# Patient Record
Sex: Male | Born: 1975 | Race: White | Hispanic: No | Marital: Single
Health system: Southern US, Community
[De-identification: ages and names within clinical notes are randomized; demographics above are authoritative.]

## PROBLEM LIST (undated history)

## (undated) DIAGNOSIS — E079 Disorder of thyroid, unspecified: Secondary | ICD-10-CM

## (undated) HISTORY — PX: TONSILLECTOMY: SUR1361

## (undated) HISTORY — PX: APPENDECTOMY: SHX54

## (undated) HISTORY — PX: HERNIA REPAIR: SHX51

## (undated) HISTORY — DX: Disorder of thyroid, unspecified: E07.9

---

## 2017-10-28 ENCOUNTER — Telehealth: Payer: Self-pay

## 2017-10-28 NOTE — Telephone Encounter (Signed)
10/28/2017 1:56PM- Called and spoke with patient, patient is aware of appointment [place] Dr. [place] [place]. Camelia EngKLopez, MA

## 2017-11-01 ENCOUNTER — Ambulatory Visit (INDEPENDENT_AMBULATORY_CARE_PROVIDER_SITE_OTHER): Payer: BLUE CROSS/BLUE SHIELD | Admitting: Urology

## 2017-11-01 ENCOUNTER — Encounter: Payer: Self-pay | Admitting: Urology

## 2017-11-01 VITALS — BP 117/76 | HR 70 | Temp 97.4°F | Resp 16 | Ht 70.0 in | Wt 180.0 lb

## 2017-11-01 DIAGNOSIS — N529 Male erectile dysfunction, unspecified: Principal | ICD-10-CM

## 2017-11-01 MED ORDER — SILDENAFIL CITRATE 20 MG OR TABS
ORAL_TABLET | ORAL | 3 refills | Status: DC
Start: 2017-11-01 — End: 2017-11-28

## 2017-11-01 MED ORDER — ARMOUR THYROID 15 MG OR TABS
ORAL_TABLET | ORAL | 2 refills | Status: AC
Start: 2017-08-13 — End: ?

## 2017-11-01 MED ORDER — SILDENAFIL CITRATE 20 MG OR TABS
ORAL_TABLET | ORAL | 3 refills | Status: DC
Start: 2017-11-01 — End: 2017-11-01

## 2017-11-01 NOTE — Patient Instructions (Signed)
1.Follow up in 4 weeks with a scrotal ultrasound to be completed prior to appointment      2.Please call radiology to schedule at [phone]    3. You may download GoodRx app to get coupons

## 2017-11-01 NOTE — Progress Notes (Signed)
Department of Urology  [facility]    Urology Clinic Visit  Visit Date: November 01, 2017    Referring Physician: Self, Referred    Primary Care Physician: Edward Galvan    Chief Complaint/Reason for Consultation:    Right epididymal cyst  Mild ED       History of Present Illness:  Edward Galvan is a 42 year old male who presents with long standing right epididymal cyst. He reports mild intermittent pain but it has been relatively asymptomatic for long time. He likes to keep an eye with scrotal US. He has been having tension at tip of penis for long time and he had inguinal hernia surgery 7 weeks ago. He also reports mild ED.     IIEF: 18-22     Past Medical and surgical History:  Reviewed        Family History:  No FH of GU cancer    Social History:  Social History     Socioeconomic History    Marital status: Married     Spouse name: Not on file    Number of children: Not on file    Years of education: Not on file    Highest education level: Not on file   Occupational History    Not on file   Social Needs    Financial resource strain: Not on file    Food insecurity:     Worry: Not on file     Inability: Not on file    Transportation needs:     Medical: Not on file     Non-medical: Not on file   Tobacco Use    Smoking status: Not on file   Substance and Sexual Activity    Alcohol use: Not on file    Drug use: Not on file    Sexual activity: Not on file   Lifestyle    Physical activity:     Days per week: Not on file     Minutes per session: Not on file    Stress: Not on file   Relationships    Social connections:     Talks on phone: Not on file     Gets together: Not on file     Attends religious service: Not on file     Active member of club or organization: Not on file     Attends meetings of clubs or organizations: Not on file     Relationship status: Not on file    Intimate partner violence:     Fear of current or ex partner: Not on file      Emotionally abused: Not on file     Physically abused: Not on file     Forced sexual activity: Not on file   Other Topics Concern    Not on file   Social History Narrative    Not on file        Review of Systems:  The review of systems was negative except as per HPI    Medications:  No current outpatient medications on file.     No current facility-administered medications for this visit.        Allergies:  Allergies not on file    Physical Examination:  Vital Signs: There were no vitals taken for this visit.    Constitutional: Appropriately groomed, not in acute distress, no major deformities  Psychiatric: Normally appearing affect and stable mood well oriented to surroundings   Head including  Eyes, Ears, Nose, Mouth and Throat: no obvious masses. No abnormalities detected, no major visual or hearing loss  Neck: No obvious masses. No abnormalities detected, no deviation of trachea  Breast Exam: Not performed  Respiratory: Unlabored respiratory effort without use of accessory muscles   Cardiovascular: Normal pulses, normal heart rate  Skin: No major cutaneous abnormalities noted    Musculoskeletal: No deformities, tenderness, no limitations for movements  Neurological: No major sensory or motor loss  Abdominal: Soft, no tenderness, no masses or organ enlargement  Back: No costovertebral angel tenderness bilaterally  Genitourinary:   Bladder: Non-palpable, non-tender     Penis: No lesions, masses, no scarring or deformity, no urethral discharge     Scrotum: 1 to 1.5 cm right epididymal cyst,  no clinical hydrocele or varicocele    Testicles: No palpable masses, average size and normal consistency           Assessment & Plan:   Edward Galvan is a 42 year old male with   Right epididymal cyst  Mild ED    For   1) Scrotal US  2) Sildenafil 20 mg on demand before sexual activity

## 2017-11-19 ENCOUNTER — Telehealth: Payer: Self-pay | Admitting: Urology

## 2017-11-19 NOTE — Telephone Encounter (Signed)
Patient states he misplaced RX for sildenafil and would like Rx reprinted to sen to [facility] on file.   Per patient, rx is not at pharmacy.   Please call him back to update.   Detailed message ok.   Thank you

## 2017-11-27 NOTE — Telephone Encounter (Signed)
2ND CALL see previous message from 11/19/17    Patient requesting a call back from office, would like to confirm if office has called of faxed Rx for Sildenafil to pharmacy. He states he misplaced the RX that was provided to him. Please call him for further assistance. Thank you

## 2017-11-28 ENCOUNTER — Other Ambulatory Visit: Payer: Self-pay

## 2017-11-28 MED ORDER — SILDENAFIL CITRATE 20 MG OR TABS
ORAL_TABLET | ORAL | 3 refills | Status: AC
Start: 2017-11-28 — End: ?

## 2017-11-28 NOTE — Telephone Encounter (Signed)
Med is set for the MD to review.

## 2017-11-28 NOTE — Telephone Encounter (Signed)
Patient is notified that the prescription is sent.   All questions answered.

## 2017-11-29 ENCOUNTER — Ambulatory Visit: Payer: BLUE CROSS/BLUE SHIELD | Admitting: Urology

## 2017-11-29 ENCOUNTER — Telehealth: Payer: Self-pay | Admitting: Urology

## 2017-11-29 NOTE — Telephone Encounter (Signed)
Called and left message for the patient regarding his appointment for today. Dr. Art BuffYaacoub had ordered for the patient to complete a scrotal ultrasound for today's appointment to go over results, looking at the chart review, patient has not completed the imaging. I left our call back number for the patient to call back and get rescheduled, giving him enough time to get imaging done for review.

## 2018-11-17 ENCOUNTER — Ambulatory Visit: Payer: Self-pay

## 2018-11-17 NOTE — Telephone Encounter (Signed)
Received the message from the system indicating that patient requests to have an appt for a stone specialist.      Called [phone] but no answer.  Left a voice message for the patient to call back.

## 2018-11-18 NOTE — Telephone Encounter (Signed)
Called [phone] but no answer. Left a voice message for the patient to call back.

## 2018-11-19 NOTE — Telephone Encounter (Signed)
PROVIDER ACTION REQUESTED: NO, FYI Only  RN ACTION: Advice given  Reason for Call: Consultation     Disposition:  An appt is scheduled with Dr. Alva Garnet on 11/26/2018 at 1000 am       Reason for Disposition  . [1] Painless rash (e.g., redness, tiny bumps, sore) AND [2] present > 24 hours    Additional Information  . Negative: Followed a genital area injury  . Negative: Pain or burning with passing urine is main symptom  . Negative: Pain in scrotum or testicle is main symptom  . Negative: Swollen scrotum OR lump in the scrotum/groin area  . Negative: Pubic lice suspected  . Negative: [1] Blood from end of penis AND [2] large amount  . Negative: [1] Not circumcised AND [2] foreskin pulled back and stuck  . Negative: [1] Looks infected (e.g., draining sore, ulcer, rash is painful to touch) AND [2] fever > 100.5 F (38.1 C)  . Negative: [1] Unable to urinate (or only a few drops) > 4 hours AND     [2] bladder feels very full (e.g., palpable bladder or strong urge to urinate)  . Negative: [1] Erection AND [2] present > 4 hours  . Negative: Patient sounds very sick or weak to the triager  . Negative: [1] Pus or bloody discharge from the end of the penis AND [2] fever  . Negative: [1] Pain or burning with passing urine AND [2] fever > 100.5 F (38.1 C)  . Negative: [1] Pain or burning with passing urine AND [2] side (flank) or back pain present  . Negative: Entire penis is swollen (i.e., edema)  . Negative: [1] Antibiotic treatment > 3 days for STD (e.g., penile discharge from gonorrhea, chlamydia)    AND [2] painful urination not improved  . Negative: Pus (white, yellow) or bloody discharge from end of penis  . Negative: Pain or burning with passing urine  . Negative: [1] Not circumcised AND [2] swollen foreskin  . Negative: [1] Tiny water blisters rash AND [2] 3 or more  . Negative: Looks infected (e.g., draining sore, ulcer, rash is painful to touch)  . Negative: Blood in urine (red, pink, or tea-colored)    Answer  Assessment - Initial Assessment Questions  1. SYMPTOM: "What's the main symptom you're concerned about?" (e.g., discharge from penis, rash, pain, itching, swelling)      Small lumps   2. LOCATION: "Where is the lump located?"      Penile shaft   3. ONSET: "When did lumps start?"      2 - 3 months  4. PAIN: "Is there any pain?" If so, ask: "How bad is it?"  (Scale 1-10; or mild, moderate, severe)      None  5. URINE: "Any difficulty passing urine?" If so, ask: "When was the last time?"      None  6. CAUSE: "What do you think is causing the symptoms?"      None  7. OTHER SYMPTOMS: "Do you have any other symptoms?" (e.g., fever, abdominal pain, blood in urine)      None    Protocols used: PENIS AND SCROTUM [facility]

## 2018-11-19 NOTE — Telephone Encounter (Signed)
Spoke with the patient who requests to set up an appt for Vasectomy consultation and non painful lumps in penile ares for a couple months.     An appt is scheduled with Dr. Alva Garnet on 11/26/2018 at 1000 am.     All questions answered.

## 2018-11-26 ENCOUNTER — Encounter: Payer: Self-pay | Admitting: Urology

## 2018-11-26 ENCOUNTER — Ambulatory Visit: Payer: BLUE CROSS/BLUE SHIELD | Admitting: Urology

## 2018-11-26 VITALS — BP 114/75 | HR 88 | Temp 98.1°F | Ht 70.0 in | Wt 179.9 lb

## 2018-11-26 DIAGNOSIS — Z3009 Encounter for other general counseling and advice on contraception: Secondary | ICD-10-CM

## 2018-11-26 DIAGNOSIS — N503 Cyst of epididymis: Secondary | ICD-10-CM

## 2018-11-26 MED ORDER — ADDERALL XR 10 MG OR CP24
ORAL_CAPSULE | ORAL | Status: AC
Start: 2018-10-12 — End: ?

## 2018-11-26 MED ORDER — THYROID 15 MG OR TABS
ORAL_TABLET | ORAL | Status: AC
Start: 2017-11-11 — End: ?

## 2018-11-26 MED ORDER — ARMOUR THYROID 90 MG OR TABS
90.00 mg | ORAL_TABLET | Freq: Every day | ORAL | Status: AC
Start: 2018-11-17 — End: ?

## 2018-11-26 NOTE — Progress Notes (Signed)
Barnet Glasgowoss Azusena Erlandson, MD  Assistant Clinical Professor of Urology  [facility] [place] -- [address]  [address] [address]  [place], [place] [id number]  [phone]    New Patient Visit    Visit Date: 11/26/2018    Referring Physician/Primary Care Physician: Becky Augustaugan, Jason     Chief Complaint/Reason for Consultation: Interest in elective sterilzation    History of Present Illness:  Dear Dr. Evert Kohlugan,  Thank you very much for referring Peri MarisGeordie Yera to our practice.    This is a 43 year old male who presents with history of epididymal cysts, and interest in vasectomy.    Number of children: 0 (girlfriend has one)    Current contraception: condoms    He reports he and his partner wish to have no more children.      He denies having any history of significant scrotal trauma, infections, or previous procedures.  Denies having any dysuria, hematuria, incontinence, constipation, or sensation of urinary retention. Patient also denies any history of urinary stones, UTI/STI, or episodes of acute retention.    He did have an episode of testicular pain (left) this was following some some intermittent sex activity for 1-2 hours.  Resolved after the following day.      He previously had mild erectile dysfunction, but this has improved.      Past Medical History:  Patient Active Problem List   Diagnosis   . Erectile dysfunction, unspecified erectile dysfunction type   . Epididymal cyst   Hypothyroidism  ?ADHD    Past Surgical History:  Appendectomy  RIH repair with mesh    Family History:  No known GU malignancies    Social History:  Social History     Socioeconomic History   . Marital status: Married     Spouse name: Not on file   . Number of children: Not on file   . Years of education: Not on file   . Highest education level: Not on file   Occupational History   . Not on file   Tobacco Use   . Smoking status: Never Smoker   . Smokeless tobacco: Never Used   Substance and Sexual Activity   . Alcohol use: Not on file   . Drug use: Not on  file   . Sexual activity: Not on file   Social Activities of Daily Living Present   . Not on file   Social History Narrative   . Not on file   Lives with girlfriend  Works in computers  No tobacco  --No children; Girlfriend has one daughter who is [id number].      Review of Systems:  12-point review of system was conducted including: systemic review, skin, head-eyes-ears-nose-throat, respiratory, cardiovascular, gastrointestinal, endocrine, genitourinary, sexual, musculoskeletal, neuro-psychiatric, and hematologic. The review of systems was negative except as per HPI     Medications:  Current Outpatient Medications   Medication Sig Dispense Refill   . ADDERALL XR 10 MG XR capsule TAKE 1 CAPSULE IN THE MORNING     . ARMOUR THYROID 15 MG tablet TAKE 1 TABLET BY MOUTH DAILY BEFORE BREAKFAST. TAKE WITH 90MG  TABLET  2   . ARMOUR THYROID 90 MG tablet Take 90 mg by mouth daily.     . sildenafil (REVATIO, VIAGRA) 20 MG tablet One tablet 1 hour before sexual activity 90 tablet 3   . thyroid (ARMOUR THYROID) 15 MG tablet TAKE 1 TABLET BY MOUTH DAILY BEFORE BREAKFAST. TAKE WITH 90MG  TABLET  No current facility-administered medications for this visit.         Allergies:  Allergies   Allergen Reactions   . Horse-Derived Products Rash        Physical Examination:  Vital Signs: BP 114/75 (BP Location: Left arm, BP Patient Position: Sitting, BP cuff size: Regular)   Pulse 88   Temp 98.1 F (36.7 C) (Temporal)   Ht 5\' 10"  (1.778 m)   Wt 81.6 kg (179 lb 14.3 oz)   BMI 25.81 kg/m   General: Awake, alert, well nourished, appears to be approximate age  HEENT: Normocephalic, atraumatic  Neck: Supple, trachea midline  Lungs: Normal respiratory effort   Cardiovascular: Regular rate to palpation  Abdomen: Soft, nontender, and nondistended  GU: Normal appearing phallus, glans, and urethral meatus.  Bilateral descended testicles with no palpable masses or tenderness to palpation.  Bilateral palpable vas deferens.  Right testicle higher  than left.  Small punctate EIC, white, on left anterior scrotum.  Extremities: no costovertebral angle tenderness to palpation bilaterally  Neuro: Grossly intact  Lymph nodes:  No Lymphadenopathy noted      Diagnostic Imaging:    Scrotal US 2017  1. Bilateral epididymal cysts. The right epididymal cyst measures 1.7 x 1.4 x 1.2 cm and corresponds to the patient's palpable finding on physical examination.    2. Normal testes.    3. Left varicocele.    Assessment: 43 year old male with history of bilateral epididymal cyst, right inguinal hernia, small punctate left anterior scrotal skin cysts, and interested elective sterilization via bilateral vasectomy.      Plan:    Total data review of medical history and data, as documented above, and development of new diagnosis and/or the decision making process for this patient (as documented below) to determine appropriate course of further evaluation and treatment is deemed as moderate.       Elective sterilization by vasectomy:  Risks, benefits, and alternatives were discussed.  Risks include but are not limited to scrotal hematoma, infection, post vasectomy chronic pain syndrome (estimated to be 1-2%), injury to the testicles, and recannulization of the vas resulting in unwanted pregnancy at any time in the future without warning despite prior semen analysis showing no sperm; this is estimated to occur 05/998 to 1/10000 cases.  Patient agreed to submit two semen sample after approximately 3 months (12 and 14 weeks).  Until we confirm that there is no sperm in the ejaculate he or his partner need to continue using another form of contraception. Patient understands that this procedure is meant to be permanent and irreversible. All questions were answered and patient wishes to proceed, and so informed consent was again confirmed for bilateral vasectomy.  Below was reviewed extensively with patient: There is no vasectomy technique that is 100% effective, and a definitive  standard to declare a patient sterile has not been agreed upon. Time to reach azoospermia is variable, although over 80% of patients achieve azoospermia by three months and after twenty ejaculations. Persistent nonmotile sperm are present in 1.4% of postvasectomy patients. This data points to obtaining a semen analysis at three months and twenty ejaculations after vasectomy to reveal azoospermia. If the semen analysis does not show azoospermia, periodic semen analyses can be obtained until azoospermia is achieved. Patients who have small numbers of persistent nonmotile sperm can be advised to cautiously discontinue contraception Valentina Lucks(Griffin et al, 2005). There is evidence that these men will ultimately reach azoospermia. Vasectomy should be repeated if any motile sperm  are found in the ejaculate three months after the initial vasectomy(Aradhya et al, 2005), or persistent large counts of non motile sperm.    I personally consented this patient for a bilateral vasectomy for sterlization and all risks/benefits/alternatives were explained at length.  Patient understands that this is an elective procedure.  All methods of birth control were again discussed.  Patient understands risks of scrotal hematoma, infection, chronic post-vasectomy pain syndrome, spontaneous recannulization of the vas resulting in pregnancy without warning at any time in the future despite a prior semen analysis showing no sperm.  Patient also told that semen analysis will be checked 3 months after the procedure and he needs to consider himself fertile until no motile sperm and no or very low counts of immotile sperm in his ejaculate is documented on 2 separate specimens.  Patient understands that this procedure should be considered permanent and not revesible, since surgical attempts to reverse this procedure often are unsuccessful.   All questions were answered to his satisfaction, he understands the above, and so informed consent was obtained  today.    In regards to his epididymal cyst, and scrotal skin cyst, I recommended continued observation as they are not very bothersome.  Explained we could potentially excise the small epidermal inclusion cyst on his left anterior scrotum, however I would recommend against this doing at the time of his vasectomy since I do think it may increase his risk of infections or other complications.  I explained to him because of his history of right inguinal hernia, and previous episodes of testicular pain or discomfort and/or his epididymal cyst, I do think he is at slightly increased risk of chronic testicular pain as compared to a typical patient.  Patient understands and agrees, and still wishes to proceed.  Patient reports he may consider sperm banking prior to his procedure, although he is confident he does not want to have children, wishes to proceed with bilateral vasectomy for elective sterility, he may wish to have this as an alternative in the future in case he changes his mind and wishes to father a child.    At the conclusion of this encounter all of the patient's questions were answered to satisfaction, encouraged to contact our office at any time if there were any further questions or issues, and we will follow up as described above.    Portions of documentation was done with dictation software, and so there may be errors despite best efforts to review and edit.      Summary:   Observation of epididymal cysts and scrotal skin cyst  Proceed with scheduling a bilateral vasectomy    Dear Dr. Leandro Reasoner,  Thank you very much for referring this patient to our practice, please contact me at any time if you have any questions regarding the above plan of care, and we will keep you apprised of this patient's progress.    Sincerely,    Otto Herb, M.D.  11/26/2018

## 2018-11-26 NOTE — Patient Instructions (Signed)
We will be scheduling you for an elective sterilization procedure known as a vasectomy.  Please review the below information and contact our office with any questions.        Elective sterilization by vasectomy:  Risks include but are not limited to scrotal hematoma, infection, post vasectomy chronic pain syndrome, injury to the testicles, and recannulization of the vas resulting in unwanted pregnancy at any time in the future without warning despite prior semen analysis showing no sperm. We will have you submit two semen sample after approximately 3 months or 30 ejaculations.  Until we confirm that there is no sperm in the ejaculate you and your partner need to continue using another form of contraception. There is no vasectomy technique that is 100% effective, and a definitive standard to declare a patient sterile has not been agreed upon. Time to reach azoospermia is variable, although over 80% of patients achieve azoospermia by three months and after twenty ejaculations. Persistent nonmotile sperm are present in 1.4% of postvasectomy patients. This data points to obtaining a semen analysis at three months and twenty ejaculations after vasectomy to reveal azoospermia. If the semen analysis does not show azoospermia, periodic semen analyses can be obtained until azoospermia is achieved. Patients who have small numbers of persistent nonmotile sperm can be advised to cautiously discontinue contraception (Griffin et al, 2005). There is evidence that these men will ultimately reach azoospermia. Vasectomy should be repeated if any motile sperm are found in the ejaculate three months after the initial vasectomy (Aradhya et al, 2005).      Having a Vasectomy: Before, During, and After the Procedure  Vasectomy is an outpatient (same day) procedure. It can be done in a doctor's office, clinic, or hospital. Your doctor will talk with you about preparing for surgery. He or she will also discuss the possible risks and  complications with you. After the procedure, follow your doctor's advice for recovery.    Your doctor will talk with you about getting ready for the procedure. You may be asked to do the following:   Sign a consent form. It gives your doctor permission to do the procedure. It also states that a vasectomy is not guaranteed to make you sterile.   Don't take aspirin, ibuprofen, or naproxen for 1 week prior to the procedure. These medications can cause bleeding after the procedure. Also, tell your doctor if you take any medications, supplements, or herbal remedies.   Tell your doctor if you've had any prior scrotal surgery.   Arrange for an adult family member or friend to give you a ride home after the procedure.   Shower and clean your scrotum the day of the procedure. Your doctor may also ask you to shave your scrotum.   Bring an athletic supporter (jockstrap) or pair of snug cotton briefs to the doctor's office or hospital.   Eat light meals/snacks prior to and following the procedure     During surgery   You'll be asked to undress and lie on a table.   You may be given medication to help you relax. To prevent pain during surgery, you'll be given an injection of local anesthetic in your scrotum or lower groin.   Once the area is numb, one or two small incisions are made in the scrotum. This may be done with a scalpel or with a pointed clamp (no-scalpel method).   The vas deferens are lifted through the incision and cut. The ends of the vas are then sealed off   with stitches.  You can rest for a while until you're ready to go home.  Recovering at home  For about a week, your scrotum may look bruised and slightly swollen. You may also have a small amount of bloody discharge from the incision. This is normal.  To help make your recovery more comfortable, follow the tips below.  Stay off your feet as much as possible for the first 2 days. Try to lie flat on a bed or  sofa.  Wear an athletic supporter or snug cotton briefs for support.  Reduce swelling by placing an ice pack or bag of frozen peas in a thin towel. Then place the towel on your scrotum.  Take medications with acetaminophen (such as Tylenol) to relieve any discomfort. Don't use aspirin, ibuprofen, or naproxen for 24-48 hours after the procedure.  Wait 24 hours before bathing.  Avoid heavy lifting or exercise for 14 days.  Ask your doctor how long to wait before having sex again. Remember: You must use another form of birth control until you're completely sterile.  When to seek medical care  Call your doctor if you notice any of the following after surgery:  Increasing pain or swelling in your scrotum  A large black-and-blue area, or a growing lump  Fever or chills  Increasing redness or drainage of the incision  Trouble urinating   Sex after vasectomy  Vasectomy doesn't change your sexual function. So when you start having sex again, it should feel the same as before. A vasectomy also shouldn't affect your relationship with your partner. It's important to remember, though, that you won't become sterile right away. It will take time before you can have sex without the need for birth control.  Until you're sterile: After a vasectomy, some active sperm still remain in your semen. It will take time and many ejaculations before the sperm are completely gone. During this period, you must use another birth control method to prevent pregnancy. To make sure no sperm are left in your semen, you'll need to have one or more semen exams. You usually collect a semen sample at home and bring it to a lab. The sample is then checked under a microscope. You're sterile only when these samples show no evidence of sperm. Ask your doctor whether additional follow-up is needed.  After you're sterile: After your doctor tells you you're sterile, you no longer need to use any form of birth control. You're free to have sex without the fear of  unwanted pregnancy. However, a vasectomy does not protect you from sexually transmitted diseases (STDs). If you have more than one sex partner, be sure to practice safer sex by using condoms.   2000-2016 [employer] [address], [place] [address]. All rights reserved. This information is not intended as a substitute for professional medical care. Always follow your healthcare professional's instructions.        Vasectomy: Risks and Complications  A vasectomy is an outpatient procedure. This means you'll go home the same day. It's done in a doctor's office, clinic, or hospital. Before your procedure, you'll be asked to read and sign a consent form. This form states you're aware of the possible risks and complications. It also says that you understand that the procedure, though most often successful, can't promise to make you sterile. Be sure you have all your questions answered before you sign this form. Below is a list of risks and possible complications of the procedure.    you sterile. Be sure you have all your questions answered before you sign this form. Below is a list of risks and possible complications of the procedure.  Risks and Possible Complications of Vasectomy  Vasectomy is safe. But it does have risks. They include the following:   Bleeding or infection   Sperm granuloma. This is a small, harmless lump. It may form where the vas deferens is sealed off.   Sperm buildup (congestion). This may cause soreness in the testes. Anti-inflammatory medications can provide relief.   Epididymitis. This is inflammation that may cause scrotal aching. It often goes away without treatment. Anti-inflammatory medications can provide relief.   Reconnection of the vas deferens. This can occur in rare cases. It makes you fertile again. This may result in an unplanned pregnancy.   Sperm antibodies. Develpoing antibodies is a common response of your body to the absorbed sperm. The antibodies can make you sterile. This is true even if you later try to reverse your vasectomy.   Long-term  testicular discomfort. This may occur after surgery. But it's very rare.    2000-2016 [employer] [address], [place] [address]. All rights reserved. This information is not intended as a substitute for professional medical care. Always follow your healthcare professional's instructions.

## 2018-12-30 ENCOUNTER — Ambulatory Visit: Payer: BLUE CROSS/BLUE SHIELD | Admitting: Urology

## 2020-10-26 NOTE — Progress Notes (Signed)
[facility] [address] [place] [address] Phone: [phone] Subjective:   Benjamin Wolfe, am serving as a scribe for Dr. Antoine Wolfe.  This visit occurred during the SARS-CoV-2 public health emergency.  Safety protocols were in place, including screening questions prior to the visit, additional usage of staff PPE, and extensive cleaning of exam room while observing appropriate contact time as indicated for disinfecting solutions.    I'm seeing this patient by the request  of:  No primary care provider on file.  CC: Right shoulder and hip pain  DXA:[id number]  Benjamin Wolfe is a 45 y.o. male coming in with complaint of R shoulder and hip pain. Patient states that he has had pain over superior aspect with flexion. No injury.  Patient tries to stay very active.  States that his symptoms seems to be on the top of the shoulder.  Gives him some discomfort with certain range of motion and sometimes even at night.  Denies radiation down the arm.  Denies any weakness.  Patient notes R sided hip pain over greater trochanter. Hip flexion can hurt at times. Does stretch a lot. Pain with bending over to put on shoes.  Patient has been trying to be active but finds it difficult.  Patient sits for work.        Social History   Socioeconomic History   Marital status: Single    Spouse name: Not on file   Number of children: Not on file   Years of education: Not on file   Highest education level: Not on file  Occupational History   Not on file  Tobacco Use   Smoking status: Not on file   Smokeless tobacco: Not on file  Substance and Sexual Activity   Alcohol use: Not on file   Drug use: Not on file   Sexual activity: Not on file  Other Topics Concern   Not on file  Social History Narrative   Not on file   Social Determinants of Health   Financial Resource Strain: Not on file  Food Insecurity: Not on file  Transportation Needs: Not on  file  Physical Activity: Not on file  Stress: Not on file  Social Connections: Not on file   Not on File History reviewed. No pertinent family history.   Current Outpatient Medications (Cardiovascular):    atorvastatin (LIPITOR) 10 MG tablet, Take by mouth.     Current Outpatient Medications (Other):    ALPRAZolam (XANAX) 0.5 MG tablet, Take by mouth.   buPROPion (WELLBUTRIN XL) 150 MG 24 hr tablet, Take 1 tablet by mouth every morning.   gabapentin (NEURONTIN) 100 MG capsule, Take 100 mg by mouth 3 (three) times daily.   Reviewed prior external information including notes and imaging from  primary care provider As well as notes that were available from care everywhere and other healthcare systems.  Past medical history, social, surgical and family history all reviewed in electronic medical record.  No pertanent information unless stated regarding to the chief complaint.   Review of Systems:  No headache, visual changes, nausea, vomiting, diarrhea, constipation, dizziness, abdominal pain, skin rash, fevers, chills, night sweats, weight loss, swollen lymph nodes, body aches, joint swelling, chest pain, shortness of breath, mood changes. POSITIVE muscle aches  Objective  Blood pressure 118/82, pulse 74, weight 168 lb (76.2 kg), SpO2 98 %.   General: No apparent distress alert and oriented x3 mood and affect normal, dressed appropriately.  Patient is  moderately anxious at baseline HEENT: Pupils equal, extraocular movements intact  Respiratory: Patient's speak in full sentences and does not appear short of breath  Cardiovascular: No lower extremity edema, non tender, no erythema  Gait normal with good balance and coordination.  MSK: Right shoulder exam shows the patient does have mild positive crossover noted.  Good range of motion though otherwise.  Tender to palpation over the acromioclavicular joint.  5 out of 5 strength of the rotator cuff noted.  Very mild positive  O'Brien's.  Right hip exam does have some tightness noted with FABER test.  Patient does have a good range of motion of the hip.  Patient is severely tender to palpation over the greater trochanteric area on the right side   Limited musculoskeletal ultrasound was performed and interpreted by Benjamin Wolfe  Limited ultrasound of patient's right shoulder shows the patient rotator cuff appears to be unremarkable.  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  Hypoechoic changes noted with effusion as well.  Regarding his right hip patient does have what appears to be a greater trochanteric bursitis with hypoechoic changes but no true tearing of the gluteal tendon laterally.  No cortical irregularity of the lateral femur.  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  This included exercises focusing on stretching, strengthening, with significant focus on eccentric aspects.   Long term goals include an improvement in range of motion, strength, endurance as well as avoiding reinjury. Patient's frequency would include in 1-2 times a day, 3-5 times a week for a duration of 6-12 weeks. Shoulder Exercises that included:  Basic scapular stabilization to include adduction and depression of scapula Scaption, focusing on proper movement and good control Internal and External rotation utilizing a theraband, with elbow tucked at side entire time Rows with theraband   Proper technique shown and discussed handout in great detail with ATC.  All questions were discussed and answered.     Impression and Recommendations:     The above documentation has been reviewed and is accurate and complete Benjamin Saa, DO

## 2020-10-27 ENCOUNTER — Other Ambulatory Visit: Payer: Self-pay

## 2020-10-27 ENCOUNTER — Ambulatory Visit: Payer: BC Managed Care – PPO | Admitting: Family Medicine

## 2020-10-27 ENCOUNTER — Ambulatory Visit (INDEPENDENT_AMBULATORY_CARE_PROVIDER_SITE_OTHER): Payer: BC Managed Care – PPO

## 2020-10-27 ENCOUNTER — Encounter: Payer: Self-pay | Admitting: Family Medicine

## 2020-10-27 ENCOUNTER — Ambulatory Visit: Payer: Self-pay

## 2020-10-27 VITALS — BP 118/82 | HR 74 | Wt 168.0 lb

## 2020-10-27 DIAGNOSIS — M25511 Pain in right shoulder: Secondary | ICD-10-CM

## 2020-10-27 DIAGNOSIS — M7061 Trochanteric bursitis, right hip: Secondary | ICD-10-CM

## 2020-10-27 DIAGNOSIS — M25551 Pain in right hip: Secondary | ICD-10-CM

## 2020-10-27 DIAGNOSIS — M19011 Primary osteoarthritis, right shoulder: Secondary | ICD-10-CM | POA: Diagnosis not present

## 2020-10-27 DIAGNOSIS — M19019 Primary osteoarthritis, unspecified shoulder: Secondary | ICD-10-CM | POA: Insufficient documentation

## 2020-10-27 NOTE — Patient Instructions (Signed)
Good to see you 3 boxes of pennsaid 2 times aday Keep hands within peripherial vision Exercises for the hip See me again in 6 weeks

## 2020-10-27 NOTE — Assessment & Plan Note (Signed)
Topical anti-inflammatories given today.  Given home exercises and work with Event organiser.  Discussed different limitation in range of motion of the shoulder with weight lifting.  Patient will see me again in 6 weeks and if no significant improvement will consider injections.

## 2020-10-27 NOTE — Assessment & Plan Note (Signed)
Mild overall.  Discussed with patient about different exercises working on hip abductor strengthening and hip flexor stretching.  We will get x-rays to further evaluate for any bony abnormality.  Patient will follow up with me again in 6 weeks and if no improvement consider injection

## 2020-12-07 NOTE — Progress Notes (Deleted)
[name] [address] [place] [address] Phone: [phone] Subjective:    I'm seeing this patient by the request  of:  No primary care provider on file.  CC:   GYI:[id number]  10/27/2020 Mild overall.  Discussed with patient about different exercises working on hip abductor strengthening and hip flexor stretching.  We will get x-rays to further evaluate for any bony abnormality.  Patient will follow up with me again in 6 weeks and if no improvement consider injection  Topical anti-inflammatories given today.  Given home exercises and work with Event organiser.  Discussed different limitation in range of motion of the shoulder with weight lifting.  Patient will see me again in 6 weeks and if no significant improvement will consider injections.  Update 12/08/2020 Benjamin Wolfe is a 45 y.o. male coming in with complaint of R hip and R shoulder pain.       No past medical history on file. No past surgical history on file. Social History   Socioeconomic History   Marital status: Single    Spouse name: Not on file   Number of children: Not on file   Years of education: Not on file   Highest education level: Not on file  Occupational History   Not on file  Tobacco Use   Smoking status: Not on file   Smokeless tobacco: Not on file  Substance and Sexual Activity   Alcohol use: Not on file   Drug use: Not on file   Sexual activity: Not on file  Other Topics Concern   Not on file  Social History Narrative   Not on file   Social Determinants of Health   Financial Resource Strain: Not on file  Food Insecurity: Not on file  Transportation Needs: Not on file  Physical Activity: Not on file  Stress: Not on file  Social Connections: Not on file   Not on File No family history on file.   Current Outpatient Medications (Cardiovascular):    atorvastatin (LIPITOR) 10 MG tablet, Take by mouth.     Current Outpatient Medications  (Other):    ALPRAZolam (XANAX) 0.5 MG tablet, Take by mouth.   buPROPion (WELLBUTRIN XL) 150 MG 24 hr tablet, Take 1 tablet by mouth every morning.   gabapentin (NEURONTIN) 100 MG capsule, Take 100 mg by mouth 3 (three) times daily.   Reviewed prior external information including notes and imaging from  primary care provider As well as notes that were available from care everywhere and other healthcare systems.  Past medical history, social, surgical and family history all reviewed in electronic medical record.  No pertanent information unless stated regarding to the chief complaint.   Review of Systems:  No headache, visual changes, nausea, vomiting, diarrhea, constipation, dizziness, abdominal pain, skin rash, fevers, chills, night sweats, weight loss, swollen lymph nodes, body aches, joint swelling, chest pain, shortness of breath, mood changes. POSITIVE muscle aches  Objective  There were no vitals taken for this visit.   General: No apparent distress alert and oriented x3 mood and affect normal, dressed appropriately.  HEENT: Pupils equal, extraocular movements intact  Respiratory: Patient's speak in full sentences and does not appear short of breath  Cardiovascular: No lower extremity edema, non tender, no erythema  Gait normal with good balance and coordination.  MSK:  Non tender with full range of motion and good stability and symmetric strength and tone of shoulders, elbows, wrist, hip, knee and ankles bilaterally.  Impression and Recommendations:     The above documentation has been reviewed and is accurate and complete Benjamin Wolfe

## 2020-12-08 ENCOUNTER — Ambulatory Visit: Payer: BC Managed Care – PPO | Admitting: Family Medicine

## 2021-06-13 ENCOUNTER — Ambulatory Visit (INDEPENDENT_AMBULATORY_CARE_PROVIDER_SITE_OTHER): Payer: BC Managed Care – PPO

## 2021-06-13 ENCOUNTER — Ambulatory Visit: Payer: Self-pay

## 2021-06-13 ENCOUNTER — Other Ambulatory Visit: Payer: Self-pay

## 2021-06-13 ENCOUNTER — Ambulatory Visit: Payer: BC Managed Care – PPO | Admitting: Family Medicine

## 2021-06-13 VITALS — BP 120/82 | HR 96 | Ht 70.0 in | Wt 180.0 lb

## 2021-06-13 DIAGNOSIS — M25561 Pain in right knee: Secondary | ICD-10-CM

## 2021-06-13 DIAGNOSIS — G8929 Other chronic pain: Secondary | ICD-10-CM

## 2021-06-13 DIAGNOSIS — M19011 Primary osteoarthritis, right shoulder: Secondary | ICD-10-CM

## 2021-06-13 DIAGNOSIS — M25511 Pain in right shoulder: Secondary | ICD-10-CM | POA: Diagnosis not present

## 2021-06-13 NOTE — Assessment & Plan Note (Signed)
patient given injection and tolerated the procedure well.  Discussed icing regimen and home exercise.  Recheck to consider which wants to avoid.  Patient responded really typically well.  Discussed home exercises.  Follow-up again in 6 to 8 weeks

## 2021-06-13 NOTE — Progress Notes (Signed)
[name] [address] [place] [address] Phone: [phone] Subjective:   Benjamin Wolfe, am serving as a scribe for Dr. Antoine Primas.This visit occurred during the SARS-CoV-2 public health emergency.  Safety protocols were in place, including screening questions prior to the visit, additional usage of staff PPE, and extensive cleaning of exam room while observing appropriate contact time as indicated for disinfecting solutions.   I'm seeing this patient by the request  of:  Pcp, No  CC: Right knee pain  AGT:[id number]  Benjamin Wolfe is a 46 y.o. male coming in with complaint of B distal bicep, AC separation and R knee pain. Pain in R shoulder persists. Feels that he may have injured himself doing DB rows.  Patient seen in June 2022. Patient states that the he also has pain over patella for years. No injury to this area. Painful with movement especially steps.       No past medical history on file. No past surgical history on file. Social History   Socioeconomic History   Marital status: Single    Spouse name: Not on file   Number of children: Not on file   Years of education: Not on file   Highest education level: Not on file  Occupational History   Not on file  Tobacco Use   Smoking status: Not on file   Smokeless tobacco: Not on file  Substance and Sexual Activity   Alcohol use: Not on file   Drug use: Not on file   Sexual activity: Not on file  Other Topics Concern   Not on file  Social History Narrative   Not on file   Social Determinants of Health   Financial Resource Strain: Not on file  Food Insecurity: Not on file  Transportation Needs: Not on file  Physical Activity: Not on file  Stress: Not on file  Social Connections: Not on file   Not on File No family history on file.   Current Outpatient Medications (Cardiovascular):    atorvastatin (LIPITOR) 10 MG tablet, Take by mouth.     Current Outpatient  Medications (Other):    ALPRAZolam (XANAX) 0.5 MG tablet, Take by mouth.   buPROPion (WELLBUTRIN XL) 150 MG 24 hr tablet, Take 1 tablet by mouth every morning.   gabapentin (NEURONTIN) 100 MG capsule, Take 100 mg by mouth 3 (three) times daily.   Reviewed prior external information including notes and imaging from  primary care provider As well as notes that were available from care everywhere and other healthcare systems.  Past medical history, social, surgical and family history all reviewed in electronic medical record.  No pertanent information unless stated regarding to the chief complaint.   Review of Systems:  No headache, visual changes, nausea, vomiting, diarrhea, constipation, dizziness, abdominal pain, skin rash, fevers, chills, night sweats, weight loss, swollen lymph nodes, body aches, joint swelling, chest pain, shortness of breath, mood changes. POSITIVE muscle aches  Objective  Blood pressure 120/82, pulse 96, height 5\' 10"  (1.778 m), weight 180 lb (81.6 kg), SpO2 98 %.   General: No apparent distress alert and oriented x3 mood and affect normal, dressed appropriately.  HEENT: Pupils equal, extraocular movements intact  Respiratory: Patient's speak in full sentences and does not appear short of breath  Cardiovascular: No lower extremity edema, non tender, no erythema   right knee exam has good range of motion.  Very mild lateral tracking of the patella noted.  Does have VMOMinorly smaller  than the contralateral side. Right shoulder continues to have tenderness to palpation over the paraspinal musculature Pain over the acromioclavicular joint.  Positive crossover.  Bicep ttp but no masses or swelling, no pain with supination or pronation against resistence   Limited muscular skeletal ultrasound was performed and interpreted by Antoine Primas, M   Limited ultrasound of the shoulder still shows hypoechoic changes of the acromioclavicular joint.  Patient's right knee has no  significant remarkable findings at the moment.   Procedure: Real-time Ultrasound Guided Injection of the right acromioclavicular joint Device: GE Logiq Q7 Ultrasound guided injection is preferred based studies that show increased duration, increased effect, greater accuracy, decreased procedural pain, increased response rate, and decreased cost with ultrasound guided versus blind injection.  Verbal informed consent obtained.  Time-out conducted.  Noted no overlying erythema, induration, or other signs of local infection.  Skin prepped in a sterile fashion.  Local anesthesia: Topical Ethyl chloride.  With sterile technique and under real time ultrasound guidance: With a 25-gauge half inch needle injected with 0.5 cc of 0.5% Marcaine and 0.5 cc of Kenalog 40 mg/mL Completed without difficulty  Pain immediately resolved suggesting accurate placement of the medication.  Advised to call if fevers/chills, erythema, induration, drainage, or persistent bleeding.  Impression: Technically successful ultrasound guided injection.     Impression and Recommendations:     The above documentation has been reviewed and is accurate and complete Judi Saa, DO

## 2021-06-13 NOTE — Patient Instructions (Addendum)
Xray today Exercises knee Arm compression  Drop weight 50%  Increase slowly See me in 6 weeks

## 2021-07-19 NOTE — Progress Notes (Signed)
?Terrilee Files D.O. ?[facility] ?[address] [place] [address] ?Phone: [phone] ?Subjective:   ?I, Nadine Counts, am serving as a Neurosurgeon for Dr. Antoine Primas. ?This visit occurred during the SARS-CoV-2 public health emergency.  Safety protocols were in place, including screening questions prior to the visit, additional usage of staff PPE, and extensive cleaning of exam room while observing appropriate contact time as indicated for disinfecting solutions.  ? ?I'm seeing this patient by the request  of:  Pcp, No ? ?CC: Right shoulder pain and right knee pain ? ?[id number]  ?06/13/2021 ? patient given injection and tolerated the procedure well.  Discussed icing regimen and home exercise.  Recheck to consider which wants to avoid.  Patient responded really typically well.  Discussed home exercises.  Follow-up again in 6 to 8 weeks ? ?Update 07/25/2021 ?Benjamin Wolfe is a 46 y.o. male coming in with complaint of R shoulder and R knee pain. Patient states shoulder is doing okay. Bilateral biceps pain. Pain was more towards the elbow. Wants to make sure nothing is mechanically. Haven't been to the gym since last visit. Certain movements cause pain, like full extension. Right ankle pain as well.  ?Patient was given an injection in the acromioclavicular joint on February 7. ? ?Xray R knee 06/13/2021 ?IMPRESSION: ?Mild patella alta. ?  ?  ? ?No past medical history on file. ?No past surgical history on file. ?Social History  ? ?Socioeconomic History  ? Marital status: Single  ?  Spouse name: Not on file  ? Number of children: Not on file  ? Years of education: Not on file  ? Highest education level: Not on file  ?Occupational History  ? Not on file  ?Tobacco Use  ? Smoking status: Not on file  ? Smokeless tobacco: Not on file  ?Substance and Sexual Activity  ? Alcohol use: Not on file  ? Drug use: Not on file  ? Sexual activity: Not on file  ?Other Topics Concern  ? Not on file  ?Social History  Narrative  ? Not on file  ? ?Social Determinants of Health  ? ?Financial Resource Strain: Not on file  ?Food Insecurity: Not on file  ?Transportation Needs: Not on file  ?Physical Activity: Not on file  ?Stress: Not on file  ?Social Connections: Not on file  ? ?Not on File ?No family history on file. ? ? ?Current Outpatient Medications (Cardiovascular):  ?  atorvastatin (LIPITOR) 10 MG tablet, Take by mouth. ? ? ? ? ?Current Outpatient Medications (Other):  ?  ALPRAZolam (XANAX) 0.5 MG tablet, Take by mouth. ?  buPROPion (WELLBUTRIN XL) 150 MG 24 hr tablet, Take 1 tablet by mouth every morning. ?  gabapentin (NEURONTIN) 100 MG capsule, Take 100 mg by mouth 3 (three) times daily. ? ? ?Reviewed prior external information including notes and imaging from  ?primary care provider ?As well as notes that were available from care everywhere and other healthcare systems. ? ?Past medical history, social, surgical and family history all reviewed in electronic medical record.  No pertanent information unless stated regarding to the chief complaint.  ? ?Review of Systems: ? No headache, visual changes, nausea, vomiting, diarrhea, constipation, dizziness, abdominal pain, skin rash, fevers, chills, night sweats, weight loss, swollen lymph nodes, joint swelling, chest pain, shortness of breath, mood changes. POSITIVE muscle aches, body aches ? ?Objective  ?Blood pressure 118/80, pulse 83, height 5\' 10"  (1.778 m), weight 178 lb (80.7 kg), SpO2 98 %. ?  ?General:  No apparent distress alert and oriented x3 mood and affect normal, dressed appropriately.  ?HEENT: Pupils equal, extraocular movements intact  ?Respiratory: Patient's speak in full sentences and does not appear short of breath  ?Cardiovascular: No lower extremity edema, non tender, no erythema  ?Gait normal with good balance and coordination.  ?MSK: Right shoulder exam shows good range of motion at this moment.  5 out of 5 strength. ?Bilateral elbow shows some tenderness  noted over the medial and lateral epicondylar regions.  Patient does have mild abnormality noted of the left elbow with more of a varus deformity noted.  Full range of motion though with good grip strength. ?Right knee exam shows overall relatively good range of motion.  Mild crepitus noted.  Mild lateral tracking noted. ? ? ?Limited muscular skeletal ultrasound was performed and interpreted by Antoine Primas, M  ?Limited ultrasound of the elbow show the patient does have hypertrophy noted of the radial head that seems to be bilateral.  No significant findings other than some mild hyperechoic changes of the underlying musculature that could be consistent with dehydration.  No significant tearing noted of the distal bicep tendon.  No abnormality noted of the supinator. 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 ?  ?Impression and Recommendations:  ?  ? ?The above documentation has been reviewed and is accurate and complete Judi Saa, DO ? ? ? ?

## 2021-07-25 ENCOUNTER — Other Ambulatory Visit: Payer: Self-pay

## 2021-07-25 ENCOUNTER — Ambulatory Visit: Payer: Self-pay

## 2021-07-25 ENCOUNTER — Ambulatory Visit (INDEPENDENT_AMBULATORY_CARE_PROVIDER_SITE_OTHER): Payer: BC Managed Care – PPO | Admitting: Family Medicine

## 2021-07-25 ENCOUNTER — Ambulatory Visit (INDEPENDENT_AMBULATORY_CARE_PROVIDER_SITE_OTHER): Payer: BC Managed Care – PPO

## 2021-07-25 VITALS — BP 118/80 | HR 83 | Ht 70.0 in | Wt 178.0 lb

## 2021-07-25 DIAGNOSIS — M25511 Pain in right shoulder: Secondary | ICD-10-CM

## 2021-07-25 DIAGNOSIS — M2550 Pain in unspecified joint: Secondary | ICD-10-CM | POA: Diagnosis not present

## 2021-07-25 DIAGNOSIS — M25522 Pain in left elbow: Secondary | ICD-10-CM | POA: Diagnosis not present

## 2021-07-25 DIAGNOSIS — M25521 Pain in right elbow: Secondary | ICD-10-CM | POA: Diagnosis not present

## 2021-07-25 DIAGNOSIS — M130 Polyarthritis, unspecified: Secondary | ICD-10-CM

## 2021-07-25 DIAGNOSIS — M25561 Pain in right knee: Secondary | ICD-10-CM | POA: Diagnosis not present

## 2021-07-25 LAB — COMPREHENSIVE METABOLIC PANEL
ALT: 24 U/L (ref 0–53)
AST: 23 U/L (ref 0–37)
Albumin: 4.9 g/dL (ref 3.5–5.2)
Alkaline Phosphatase: 57 U/L (ref 39–117)
BUN: 24 mg/dL — ABNORMAL HIGH (ref 6–23)
CO2: 29 mEq/L (ref 19–32)
Calcium: 10.1 mg/dL (ref 8.4–10.5)
Chloride: 98 mEq/L (ref 96–112)
Creatinine, Ser: 1.27 mg/dL (ref 0.40–1.50)
GFR: 68.07 mL/min (ref >60.00)
Glucose, Bld: 110 mg/dL — ABNORMAL HIGH (ref 70–99)
Potassium: 4.1 mEq/L (ref 3.5–5.1)
Sodium: 136 mEq/L (ref 135–145)
Total Bilirubin: 0.7 mg/dL (ref 0.2–1.2)
Total Protein: 7.5 g/dL (ref 6.0–8.3)

## 2021-07-25 LAB — CBC WITH DIFFERENTIAL/PLATELET
Basophils Absolute: 0.1 10*3/uL (ref 0.0–0.1)
Basophils Relative: 1.3 % (ref 0.0–3.0)
Eosinophils Absolute: 0.2 10*3/uL (ref 0.0–0.7)
Eosinophils Relative: 3.2 % (ref 0.0–5.0)
HCT: 43.9 % (ref 39.0–52.0)
Hemoglobin: 15.6 g/dL (ref 13.0–17.0)
Lymphocytes Relative: 30.6 % (ref 12.0–46.0)
Lymphs Abs: 1.8 10*3/uL (ref 0.7–4.0)
MCHC: 35.6 g/dL (ref 30.0–36.0)
MCV: 87.6 fl (ref 78.0–100.0)
Monocytes Absolute: 0.5 10*3/uL (ref 0.1–1.0)
Monocytes Relative: 8.3 % (ref 3.0–12.0)
Neutro Abs: 3.4 10*3/uL (ref 1.4–7.7)
Neutrophils Relative %: 56.6 % (ref 43.0–77.0)
Platelets: 243 10*3/uL (ref 150.0–400.0)
RBC: 5.01 Mil/uL (ref 4.22–5.81)
RDW: 13 % (ref 11.5–15.5)
WBC: 5.9 10*3/uL (ref 4.0–10.5)

## 2021-07-25 LAB — T4, FREE: Free T4: 0.62 ng/dL (ref 0.60–1.60)

## 2021-07-25 LAB — TSH: TSH: 4.75 u[IU]/mL (ref 0.35–5.50)

## 2021-07-25 LAB — URIC ACID: Uric Acid, Serum: 5.4 mg/dL (ref 4.0–7.8)

## 2021-07-25 LAB — IBC PANEL
Iron: 92 ug/dL (ref 42–165)
Saturation Ratios: 29.7 % (ref 20.0–50.0)
TIBC: 309.4 ug/dL (ref 250.0–450.0)
Transferrin: 221 mg/dL (ref 212.0–360.0)

## 2021-07-25 LAB — FERRITIN: Ferritin: 168.1 ng/mL (ref 22.0–322.0)

## 2021-07-25 LAB — VITAMIN B12: Vitamin B-12: 730 pg/mL (ref 211–911)

## 2021-07-25 LAB — VITAMIN D 25 HYDROXY (VIT D DEFICIENCY, FRACTURES): VITD: 42.06 ng/mL (ref 30.00–100.00)

## 2021-07-25 LAB — SEDIMENTATION RATE: Sed Rate: 1 mm/hr (ref 0–15)

## 2021-07-25 LAB — C-REACTIVE PROTEIN: CRP: <1 mg/dL (ref 0.5–20.0)

## 2021-07-25 LAB — TESTOSTERONE: Testosterone: 352.47 ng/dL (ref 300.00–890.00)

## 2021-07-25 LAB — T3, FREE: T3, Free: 4.1 pg/mL (ref 2.3–4.2)

## 2021-07-25 LAB — CK: Total CK: 64 U/L (ref 7–232)

## 2021-07-25 NOTE — Patient Instructions (Addendum)
Do prescribed exercises at least 3x a week ?Xray today ? ?See you again in 6 weeks ?

## 2021-07-26 DIAGNOSIS — M130 Polyarthritis, unspecified: Secondary | ICD-10-CM | POA: Insufficient documentation

## 2021-07-26 NOTE — Assessment & Plan Note (Signed)
Patient has had difficulty with the leg pain previously.  On exam today nothing is very specific.  Having more worsening pain of the elbows and anywhere else.  We will continue to monitor.  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 ?

## 2021-07-26 NOTE — Assessment & Plan Note (Signed)
Patient is complaining of multiple different areas of pain.  Do feel that further evaluation with laboratory work-up would be beneficial in this individual.  Discussed with patient about this and we will get labs to further evaluate. ?

## 2021-07-27 LAB — D-DIMER, QUANTITATIVE: D-Dimer, Quant: 0.21 mcg/mL FEU (ref <0.50)

## 2021-07-27 LAB — ANGIOTENSIN CONVERTING ENZYME: Angiotensin-Converting Enzyme: 25 U/L (ref 9–67)

## 2021-07-27 LAB — CALCIUM, IONIZED: Calcium, Ion: 5.4 mg/dL (ref 4.7–5.5)

## 2021-07-27 LAB — ANA: Anti Nuclear Antibody (ANA): NEGATIVE

## 2021-07-27 LAB — CYCLIC CITRUL PEPTIDE ANTIBODY, IGG: Cyclic Citrullin Peptide Ab: <16 UNITS

## 2021-07-27 LAB — PTH, INTACT AND CALCIUM
Calcium: 10.2 mg/dL (ref 8.6–10.3)
PTH: 20 pg/mL (ref 16–77)

## 2021-07-27 LAB — RHEUMATOID FACTOR: Rheumatoid fact SerPl-aCnc: <14 IU/mL (ref <14)

## 2021-08-02 ENCOUNTER — Telehealth: Payer: Self-pay | Admitting: Family Medicine

## 2021-08-02 NOTE — Telephone Encounter (Signed)
Pt calling for lab and xray results from last visit, does not have MyChart. ?

## 2021-08-03 NOTE — Telephone Encounter (Signed)
All labs are normal which is great  

## 2021-08-03 NOTE — Telephone Encounter (Signed)
Spoke with patient per results.  

## 2021-09-07 ENCOUNTER — Telehealth: Payer: Self-pay | Admitting: Family Medicine

## 2021-09-07 NOTE — Telephone Encounter (Signed)
Patient called regarding his upcoming appointment (Tuesday at 4pm). He said that he would be coming from [place] and it is hard for him to do that at this point.  ?He asked if he could do this appointment as a phone visit or even cancel if it is not needed. ?His shoulders have improved and are feeling some better but he is still having trouble with both elbows. ?Is there something Dr Katrinka Blazing could recommend or would it be best for him to keep his appointment and come in? ? ?Please advise. ? ?

## 2021-09-08 NOTE — Telephone Encounter (Signed)
Yes we can do phone and discuss

## 2021-09-08 NOTE — Telephone Encounter (Signed)
Left message informing patient and changed appointment note to reflect a phone visit. ?

## 2021-09-12 ENCOUNTER — Encounter: Payer: Self-pay | Admitting: Family Medicine

## 2021-09-12 ENCOUNTER — Ambulatory Visit (INDEPENDENT_AMBULATORY_CARE_PROVIDER_SITE_OTHER): Payer: BC Managed Care – PPO | Admitting: Family Medicine

## 2021-09-12 DIAGNOSIS — M25521 Pain in right elbow: Secondary | ICD-10-CM | POA: Insufficient documentation

## 2021-09-12 DIAGNOSIS — M25522 Pain in left elbow: Secondary | ICD-10-CM

## 2021-09-12 MED ORDER — MELOXICAM 15 MG PO TABS: 15.0000 mg | ORAL_TABLET | Freq: Every day | ORAL | 0 refills | Status: DC

## 2021-09-12 NOTE — Assessment & Plan Note (Signed)
Bilateral elbow exam has previously seem to be more of a tendinopathy.  We discussed formal physical therapy which patient declined.  We will do a anti-inflammatory for short course.  Patient is concerned with any type of interaction with his Wellbutrin.  We discussed with him the different signs and symptoms of side effects of the medication.  Patient will stop him if he gets any of those.  We discussed with patient still to watch ergonomics throughout the day and see if there is any other changes he can make.  Worsening pain would consider the formal physical therapy such as dry needling and the possibility of coming back for injections but hopefully not necessary.  Patient will follow-up with me either through [facility], telephone visit or in office in 6 to 8 weeks ?

## 2021-09-12 NOTE — Progress Notes (Signed)
Virtual Visit via Video Note ? ?I connected with Benjamin Wolfe on 09/12/21 at  4:00 PM EDT by a video enabled telemedicine application and verified that I am speaking with the correct person using two identifiers.  Unable to do visual with patient being in his car and changed to a phone call ? ?Location: ?Patient: Patient is in his car, alone ?Provider: In office setting ?  ?I discussed the limitations of evaluation and management by telemedicine and the availability of in person appointments. The patient expressed understanding and agreed to proceed. ? 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  Patient states those are seem to be doing better but continues to have pain in both elbows.  Patient states shoulder seems to be feeling better but continues to have elbow pain.  States that with any type of flexion especially of the biceps does have more discomfort and pain.  Describes the pain as a dull, throbbing aching sensation. ? ?  ?Observations/Objective: Patient sounds comfortable, driving appropriately.  States that when he is doing flexion on his own even without weight he has some discomfort in his elbows. ? ? ?Assessment and Plan:Bilateral elbow joint pain ?Bilateral elbow exam has previously seem to be more of a tendinopathy.  We discussed formal physical therapy which patient declined.  We will do a anti-inflammatory for short course.  Patient is concerned with any type of interaction with his Wellbutrin.  We discussed with him the different signs and symptoms of side effects of the medication.  Patient will stop him if he gets any of those.  We discussed with patient still to watch ergonomics throughout the day and see if there is any other changes he can make.  Worsening pain would consider the formal physical therapy such as dry needling and the possibility of coming back for injections but hopefully not necessary.  Patient will follow-up with  me either through MyChart, telephone visit or in office in 6 to 8 weeks  ? ? ?Follow Up Instructions: As stated above ? ?  ?I discussed the assessment and treatment plan with the patient. The patient was provided an opportunity to ask questions and all were answered. The patient agreed with the plan and demonstrated an understanding of the instructions. ?  ?The patient was advised to call back or seek an in-person evaluation if the symptoms worsen or if the condition fails to improve as anticipated. ? ?I provided 16 minutes of non-face-to-face time during this encounter including reviewing patient's previous notes and medications and any interactions that would occur. ? ? ?Judi Saa, DO ? ?

## 2021-12-25 NOTE — Progress Notes (Unsigned)
    Aleen Sells D.[facility] [address] [place] [address] Phone: [phone]   Assessment and Plan:     There are no diagnoses linked to this encounter.  ***   Pertinent previous records reviewed include ***   Follow Up: ***     Subjective:   I, Verlon Carcione, am serving as a Neurosurgeon for Doctor Richardean Sale  Chief Complaint: right calf pain  HPI:   12/26/2021 Patient is a 46 year old male complaining of right calf pain. Patient states    Relevant Historical Information: ***  Additional pertinent review of systems negative.   Current Outpatient Medications:    ALPRAZolam (XANAX) 0.5 MG tablet, Take by mouth., Disp: , Rfl:    atorvastatin (LIPITOR) 10 MG tablet, Take by mouth., Disp: , Rfl:    buPROPion (WELLBUTRIN XL) 150 MG 24 hr tablet, Take 1 tablet by mouth every morning., Disp: , Rfl:    gabapentin (NEURONTIN) 100 MG capsule, Take 100 mg by mouth 3 (three) times daily., Disp: , Rfl:    meloxicam (MOBIC) 15 MG tablet, Take 1 tablet (15 mg total) by mouth daily., Disp: 30 tablet, Rfl: 0   Objective:     There were no vitals filed for this visit.    There is no height or weight on file to calculate BMI.    Physical Exam:    ***   Electronically signed by:  Aleen Sells D.[facility] 8:19 AM 12/25/21

## 2021-12-26 ENCOUNTER — Ambulatory Visit: Payer: Self-pay

## 2021-12-26 ENCOUNTER — Ambulatory Visit: Payer: BC Managed Care – PPO | Admitting: Sports Medicine

## 2021-12-26 VITALS — BP 124/80 | HR 91 | Ht 70.0 in | Wt 178.0 lb

## 2021-12-26 DIAGNOSIS — M79661 Pain in right lower leg: Secondary | ICD-10-CM

## 2021-12-26 MED ORDER — MELOXICAM 15 MG PO TABS: 15.0000 mg | ORAL_TABLET | Freq: Every day | ORAL | 0 refills | Status: DC

## 2021-12-26 NOTE — Patient Instructions (Addendum)
Good to see you   Start meloxicam 15 mg daily x2 weeks.  If still having pain after 2 weeks, complete 3rd-week of meloxicam. May use remaining meloxicam as needed once daily for pain control.  Do not to use additional NSAIDs while taking meloxicam.  May use Tylenol [phone] mg 2 to 3 times a day for breakthrough pain. Calf achilles HEP  As needed follow up if no improvement 2-4 weeks

## 2022-01-02 ENCOUNTER — Ambulatory Visit: Payer: BC Managed Care – PPO | Admitting: Family Medicine

## 2022-01-03 ENCOUNTER — Ambulatory Visit: Payer: BC Managed Care – PPO | Admitting: Family Medicine

## 2022-03-14 LAB — EXTERNAL GENERIC LAB PROCEDURE: COLOGUARD: NEGATIVE

## 2022-03-14 LAB — COLOGUARD: COLOGUARD: NEGATIVE

## 2023-06-14 IMAGING — DX DG ELBOW COMPLETE 3+V*R*
4 series · 4 of 4 positions shown · non-contrast
Comparison: None.

CLINICAL DATA: Bilateral elbow pain for 2 months

EXAM:
LEFT ELBOW - COMPLETE 3+ VIEW; RIGHT ELBOW - COMPLETE 3+ VIEW

[elbow ap]
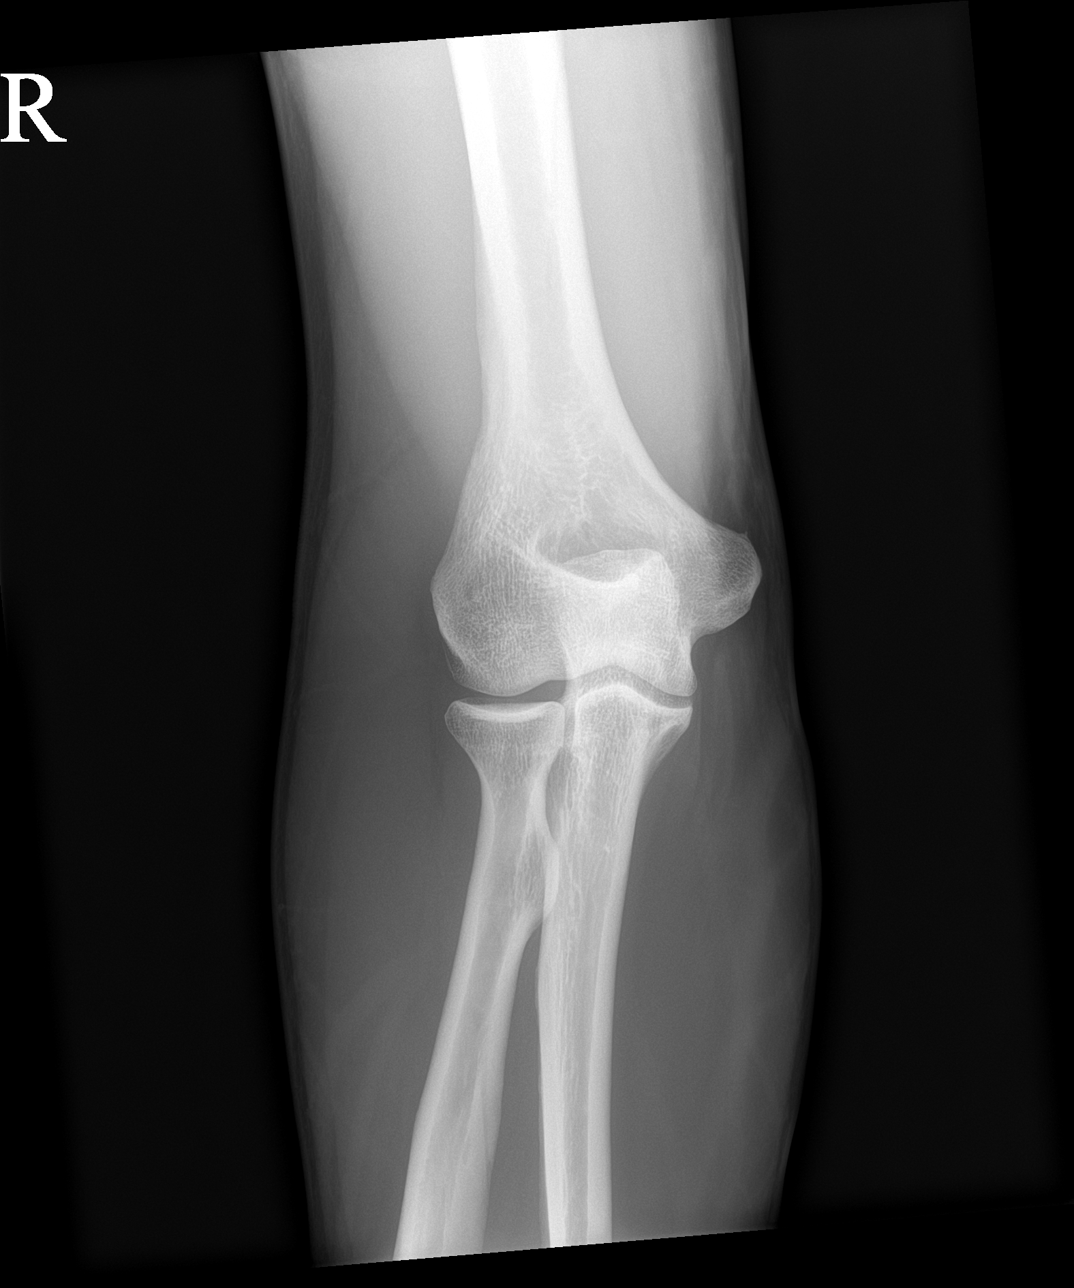

[elbow obl (1 of 2)]
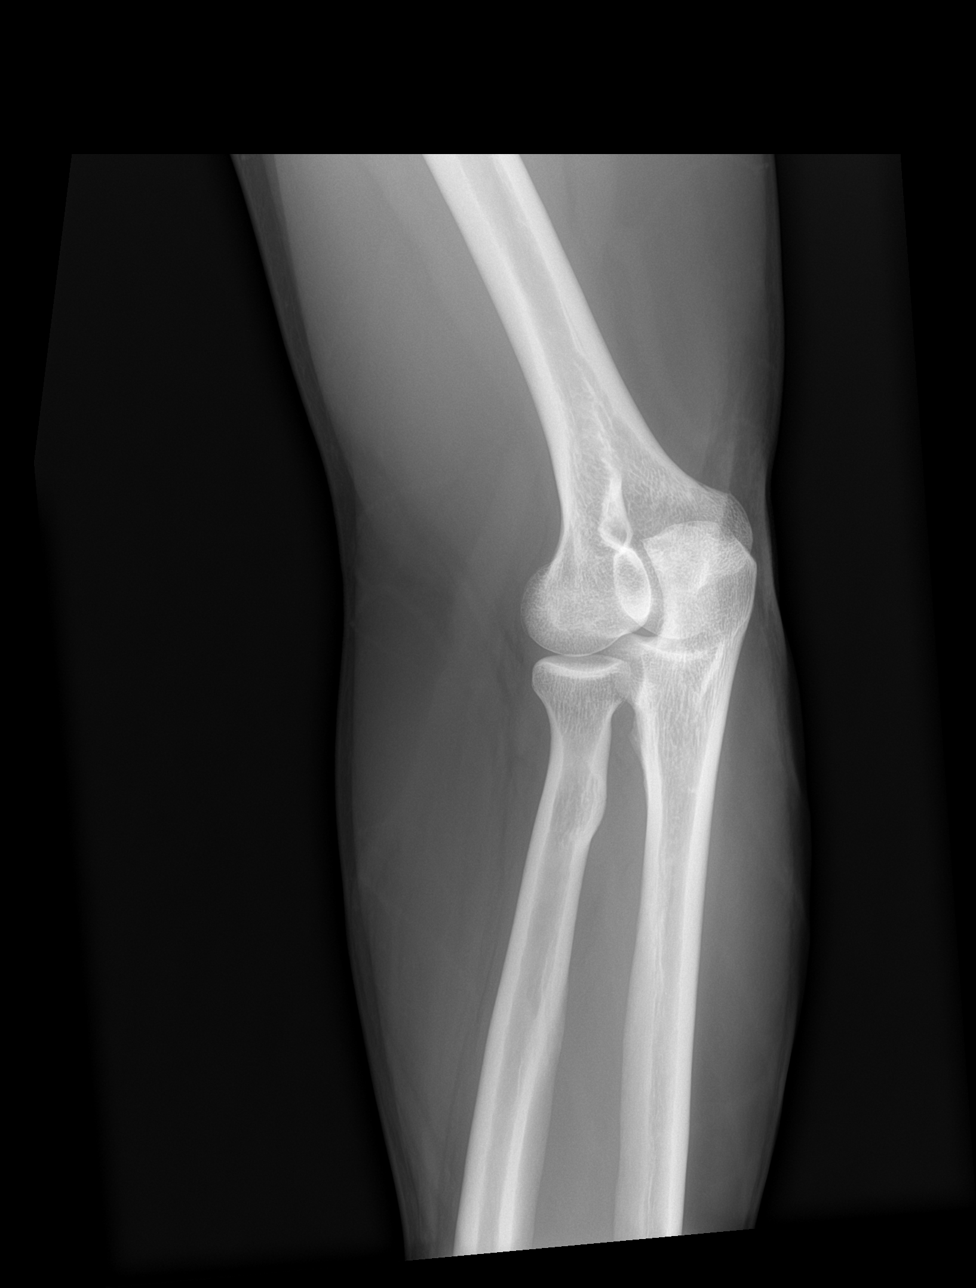

[elbow obl (2 of 2)]
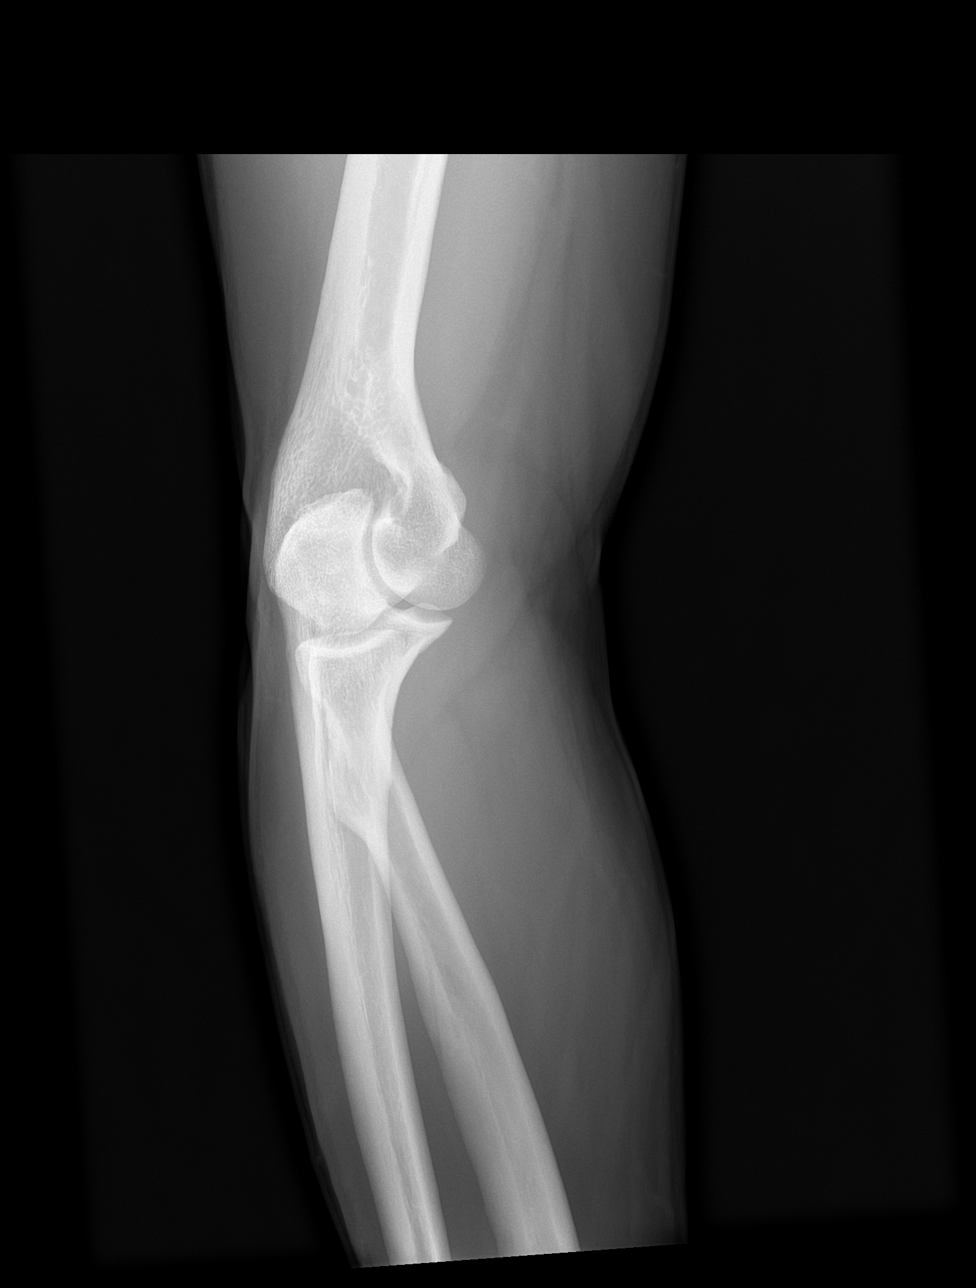

[elbow lat]
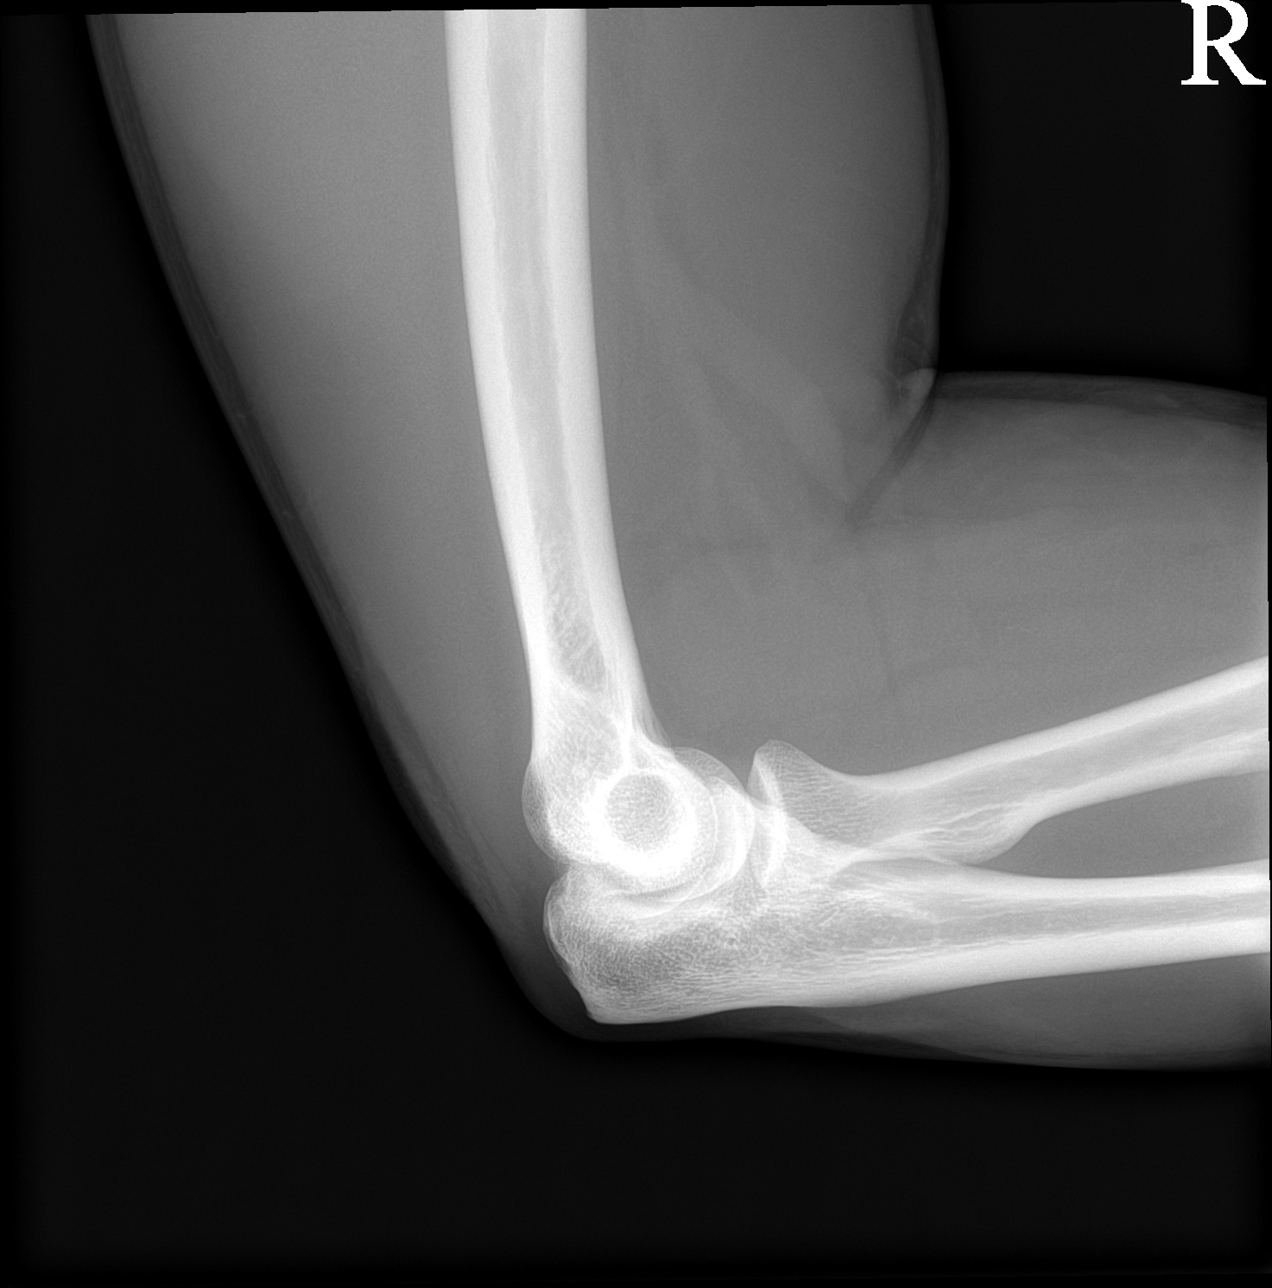

[4 of 4 positions shown; findings below may reference images not displayed]

FINDINGS: Right elbow: Frontal, bilateral oblique, and lateral views are
obtained. No acute fracture, subluxation, or dislocation. Joint
spaces are well preserved. No joint effusion. Soft tissues are
unremarkable.

Left elbow: Frontal, bilateral oblique, lateral views are obtained.
No fracture, subluxation, or dislocation. Joint spaces are well
preserved. No joint effusion. Soft tissues are unremarkable.
IMPRESSION: 1. Unremarkable bilateral elbows.

## 2023-07-16 ENCOUNTER — Ambulatory Visit: Admitting: Family Medicine

## 2023-07-16 VITALS — BP 132/86 | HR 86 | Ht 70.0 in | Wt 175.0 lb

## 2023-07-16 DIAGNOSIS — S29011A Strain of muscle and tendon of front wall of thorax, initial encounter: Secondary | ICD-10-CM

## 2023-07-16 NOTE — Patient Instructions (Addendum)
 Thank you for coming in today.   Please work on the home exercises the athletic trainer went over with you:  View at [email or web address] using code: [id number]  If not getting better, let us know and we will refer to physical therapy

## 2023-07-16 NOTE — Progress Notes (Signed)
   Rubin Payor, PhD, LAT, ATC acting as a scribe for Clementeen Graham, MD.  Benjamin Wolfe is a 48 y.o. male who presents to [facility] today for R pectoral pain. Pt was previously seen in 2023 by Dr. Jean Rosenthal for R calf and bilat elbow pain.   Today, pt c/o R pectoral pain x a month. He thinks pain may be related to moving furniture. Pt locates pain to the lateral portion of his R pec and the medial aspect of the proximal humerus. Pain does not effect his ADLs.  Aggravates: horz aDd, Treatments tried: none  Pertinent review of systems: No fevers or chills  Relevant historical information: Various MSK pains otherwise healthy   Exam:  BP 132/86   Pulse 86   Ht 5\' 10"  (1.778 m)   Wt 175 lb (79.4 kg)   SpO2 99%   BMI 25.11 kg/m  General: Well Developed, well nourished, and in no acute distress.   MSK: Right chest tender palpation distal pectoralis tendon.  Normal arm motion.  Pain with resisted arm and chest abduction and chest press motions.        Assessment and Plan: 48 y.o. male with right pectoralis tendon strain.  Plan for home exercise program taught in clinic today.  Consider formal PT.  [place] location would probably be best option. He will reach out if not improving.   PDMP not reviewed this encounter. No orders of the defined types were placed in this encounter.  No orders of the defined types were placed in this encounter.    Discussed warning signs or symptoms. Please see discharge instructions. Patient expresses understanding.   The above documentation has been reviewed and is accurate and complete Clementeen Graham, M.D.

## 2023-10-01 ENCOUNTER — Telehealth: Payer: Self-pay | Admitting: Family Medicine

## 2023-10-01 NOTE — Telephone Encounter (Signed)
 Patient came into the office requesting an appointment with Dr Felipe Horton. He has had continued right shoulder/pectoral/arm pit pain and discomfort.   He mentioned some swelling in his arm pit area as well. Patient is scheduled for 10/17/23 and on the cancellation for a sooner appointment.  Just FYI.

## 2023-10-07 NOTE — Progress Notes (Signed)
 Benjamin Wolfe [address] [place] [address] Phone: [phone] Subjective:   Benjamin Wolfe, am serving as a scribe for Dr. Ronnell Wolfe.  I'Benjamin seeing this patient by the request  of:  Pcp, No  CC: Right shoulder pain  [id number]  Benjamin Wolfe is a 48 y.o. male coming in with complaint of R shoulder pain. Seen for [facility] pain in 2023. In March 2025, saw Dr. Alease Wolfe for pec strain. Patient states pec arm pit pain. Pain has stayed about the same. Home therapies didn't really touch it. Sharp with certain motions.   X-rays of the right shoulder 2022 show degenerative changes of the acromioclavicular joint.    Reviewing patient's chart was to get another CT scan without contrast in the near future secondary to some peritracheal lymph nodes on the right side  No past medical history on file. No past surgical history on file. Social History   Socioeconomic History   Marital status: Single    Spouse name: Not on file   Number of children: Not on file   Years of education: Not on file   Highest education level: Not on file  Occupational History   Not on file  Tobacco Use   Smoking status: Not on file   Smokeless tobacco: Not on file  Substance and Sexual Activity   Alcohol use: Not on file   Drug use: Not on file   Sexual activity: Not on file  Other Topics Concern   Not on file  Social History Narrative   Not on file   Social Drivers of Health   Financial Resource Strain: Low Risk  (06/10/2023)   Received from [facility]   Overall Financial Resource Strain (CARDIA)    Difficulty of Paying Living Expenses: Not very hard  Food Insecurity: No Food Insecurity (06/10/2023)   Received from [facility]   Hunger Vital Sign    Worried About Running Out of Food in the Last Year: Never true    Ran Out of Food in the Last Year: Never true  Transportation Needs: No Transportation Needs (06/10/2023)   Received from [facility]  - Transportation    Lack of Transportation (Medical): No    Lack of Transportation (Non-Medical): No  Physical Activity: Insufficiently Active (06/05/2022)   Received from [facility]   Exercise Vital Sign    Days of Exercise per Week: 4 days    Minutes of Exercise per Session: 30 min  Stress: Stress Concern Present (06/05/2022)   Received from [facility] Occup[facility] - Occupational Stress Questionnaire    Feeling of Stress : To some extent  Social Connections: Somewhat Isolated (06/05/2022)   Received from [facility]   Social Network    How would you rate your social network (family, work, friends)?: Restricted participation with some degree of social isolation   Not on File No family history on file.  Current Outpatient Medications (Endocrine & Metabolic):    levothyroxine (SYNTHROID) 88 MCG tablet, Take 88 mcg by mouth daily.      Current Outpatient Medications (Other):    ALPRAZolam (XANAX) 0.5 MG tablet, Take by mouth.   buPROPion (WELLBUTRIN XL) 150 MG 24 hr tablet, Take 1 tablet by mouth every morning.   Reviewed prior external information including notes and imaging from  primary care provider As well as notes that were available from care everywhere and other healthcare systems.  Past medical history,  social, surgical and family history all reviewed in electronic medical record.  No pertanent information unless stated regarding to the chief complaint.   Review of Systems:  No headache, visual changes, nausea, vomiting, diarrhea, constipation, dizziness, abdominal pain, skin rash, fevers, chills, night sweats, weight loss, swollen lymph nodes, body aches, joint swelling, chest pain, shortness of breath, mood changes. POSITIVE muscle aches  Objective  Blood pressure 108/76, pulse 87, height 5\' 10"  (1.778 Benjamin), weight 177 lb (80.3 kg), SpO2 97%.   General: No apparent distress alert and oriented x3 mood and affect normal, dressed  appropriately.  HEENT: Pupils equal, extraocular movements intact  Respiratory: Patient's speak in full sentences and does not appear short of breath  Cardiovascular: No lower extremity edema, non tender, no erythema  Right shoulder exam shows good range of motion noted.  Still has a positive crossover noted.  Significant tenderness still noted in the chest musculature but no masses appreciated.  Patient has worsening pain with resisted adduction of the arm  Limited muscular skeletal ultrasound was performed and interpreted by Benjamin Wolfe, Benjamin  Limited ultrasound shows some hyperechoic changes at the musculotendinous juncture of the pectoralis muscle that is consistent with a healing tear.  No increase in the hypoechoic changes or Doppler flow though.  No masses appreciated. Impression: Healing pectoralis muscular tendon tear   Impression and Recommendations:    The above documentation has been reviewed and is accurate and complete Benjamin Wolfe Benjamin [name], DO

## 2023-10-08 ENCOUNTER — Ambulatory Visit: Admitting: Family Medicine

## 2023-10-08 ENCOUNTER — Encounter: Payer: Self-pay | Admitting: Family Medicine

## 2023-10-08 ENCOUNTER — Other Ambulatory Visit: Payer: Self-pay

## 2023-10-08 ENCOUNTER — Ambulatory Visit (INDEPENDENT_AMBULATORY_CARE_PROVIDER_SITE_OTHER)

## 2023-10-08 VITALS — BP 108/76 | HR 87 | Ht 70.0 in | Wt 177.0 lb

## 2023-10-08 DIAGNOSIS — S29011A Strain of muscle and tendon of front wall of thorax, initial encounter: Secondary | ICD-10-CM

## 2023-10-08 DIAGNOSIS — M25511 Pain in right shoulder: Secondary | ICD-10-CM | POA: Diagnosis not present

## 2023-10-08 DIAGNOSIS — R9389 Abnormal findings on diagnostic imaging of other specified body structures: Secondary | ICD-10-CM | POA: Diagnosis not present

## 2023-10-08 DIAGNOSIS — M7581 Other shoulder lesions, right shoulder: Secondary | ICD-10-CM | POA: Diagnosis not present

## 2023-10-08 NOTE — Assessment & Plan Note (Signed)
 Seems to have some chronic changes noted in this area.  Unfortunately patient has also had a right sided peritracheal nodules as noted previously and do feel that further imaging with a CT scan without contrast is necessary as well.  Discussed with patient about different treatment options and patient has elected to try conservative therapy at the moment.  Discussed with patient about icing regimen, home exercises, discussed avoiding certain range of motion.  Discussed the importance of eccentric strengthening.  Follow-up again in 6 weeks otherwise to further evaluate treatment.

## 2023-10-08 NOTE — Patient Instructions (Addendum)
 Do prescribed exercises at least 3x a week PT at [facility]  Xray shoulder [facility] [phone] Call Today  When we receive your results we will contact you.

## 2023-10-10 ENCOUNTER — Other Ambulatory Visit

## 2023-10-14 ENCOUNTER — Ambulatory Visit: Payer: Self-pay | Admitting: Family Medicine

## 2023-10-17 ENCOUNTER — Ambulatory Visit: Admitting: Family Medicine

## 2023-10-30 ENCOUNTER — Ambulatory Visit: Admitting: Physical Therapy

## 2023-10-30 DIAGNOSIS — G8929 Other chronic pain: Secondary | ICD-10-CM | POA: Diagnosis not present

## 2023-10-30 DIAGNOSIS — M25511 Pain in right shoulder: Secondary | ICD-10-CM | POA: Diagnosis not present

## 2023-10-30 DIAGNOSIS — M6281 Muscle weakness (generalized): Secondary | ICD-10-CM | POA: Diagnosis not present

## 2023-10-30 NOTE — Therapy (Addendum)
 OUTPATIENT PHYSICAL THERAPY EVALUATION  DISCHARGE   Patient Name: Benjamin Wolfe MRN: [id number] DOB:07/18/1975, 48 y.o., male Today's Date: 10/31/2023   END OF SESSION:  PT End of Session - 10/31/23 0751     Visit Number 1    Number of Visits 6    Date for PT Re-Evaluation 12/11/23    Authorization Type [employer]    PT Start Time 1605    PT Stop Time 1645    PT Time Calculation (min) 40 min    Activity Tolerance Patient tolerated treatment well    Behavior During Therapy [facility] for tasks assessed/performed          History reviewed. No pertinent past medical history. History reviewed. No pertinent surgical history. 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 States he has improved with some exercises since he saw the doctor. He reports she can still feel the discomfort when he turns the steering wheel of his car. He does have history of right shoulder injury.   Hand dominance: Left  PERTINENT HISTORY: See PMH above  PAIN:  Are you having pain? Yes:  NPRS scale: 1/10 Pain location: Right pec Pain description: Sharp Aggravating factors: Turning steering wheel, reaching across his body Relieving factors: Rest  PRECAUTIONS: None  RED FLAGS: None   WEIGHT BEARING  RESTRICTIONS: No  FALLS:  Has patient fallen in last 6 months? No  PLOF: Independent  PATIENT GOALS: Pain relief   OBJECTIVE:  Note: Objective measures were completed at Evaluation unless otherwise noted. PATIENT SURVEYS:  PSFS: 3.6 Driving (turning steering wheel to left): 1 Reaching high item at home: 4 Rehab exercises for injury provided 2 months ago: 6  COGNITION: Overall cognitive status: Within functional limits for tasks assessed     SENSATION: WFL  POSTURE: Rounded shoulder and forward head posture increased thoracic kyphosis and inferior angle scapular winging  UPPER EXTREMITY ROM:   Active ROM Right eval Left eval  Shoulder flexion 130 155  Shoulder extension    Shoulder abduction 160 170  Shoulder adduction    Shoulder internal rotation T10 T7  Shoulder external rotation T1 T2  Elbow flexion    Elbow extension    Wrist flexion    Wrist extension    Wrist ulnar deviation    Wrist radial deviation    Wrist pronation    Wrist supination    (Blank rows = not tested)  UPPER EXTREMITY MMT:  MMT Right eval Left eval  Shoulder flexion 5 5  Shoulder extension    Shoulder abduction 5 5  Shoulder adduction    Shoulder internal rotation 4 5  Shoulder external rotation 4+ 5  Middle trapezius 4 4  Lower trapezius 4- 4-  Elbow flexion    Elbow extension    Wrist flexion  Wrist extension    Wrist ulnar deviation    Wrist radial deviation    Wrist pronation    Wrist supination    Grip strength (lbs)    (Blank rows = not tested)  SHOULDER SPECIAL TESTS: Not assessed  JOINT MOBILITY TESTING:  Right GHJ grossly WFL  PALPATION:  Mildly tender with deep palpation to right pec major closer to insertional region                                                                                                                             TREATMENT [facility] Adult PT Treatment:                                                DATE: 10/30/2023 Single arm  doorway pec stretch with emphasis on scapular retraction, depression, posterior tilt Push-up plus from elevated height progressing down as tolerated Supine chest fly preferably on FR beginning with light weights/cans of soup and progressing as tolerated Prone T Standing single arm wall slide with thumb or palm back  Discussed use of modalities and soft tissue work vs active exercise to improve pec mobility and realignment of muscle fibers following muscle strain. Gradual progressive loading of pec through full range of motion, continuing with previously provided exercises that seem more geared toward scapular control.   PATIENT EDUCATION: Education details: Exam findings, POC, HEP Person educated: Patient Education method: Explanation, Demonstration, Tactile cues, Verbal cues, and Handouts Education comprehension: verbalized understanding, returned demonstration, verbal cues required, tactile cues required, and needs further education  HOME EXERCISE PROGRAM: Access Code: [email or web address]    ASSESSMENT: CLINICAL IMPRESSION: Patient is a 48 y.o. male who was seen today for physical therapy evaluation and treatment for chronic right pec/shoulder pain that is consistent with pec major strain. He does exhibit some limitations with his right shoulder active motion with normal passive motion indicating more strength and control deficit. He does have some weakness about the right shoulder and periscapular region with postural deficits and scapular dyskinesis. He does continue to report pain with more active horizontal adduction related movements consistent with his pec strain. Exercises provided this visit to target mobility and progressive loading of the pec, as well as addressing shoulder strength, control, and active motion.  OBJECTIVE IMPAIRMENTS: decreased activity tolerance, decreased ROM, decreased strength, impaired flexibility, postural dysfunction, and pain.   ACTIVITY LIMITATIONS: carrying,  lifting, bathing, dressing, reach over head, and hygiene/grooming  PARTICIPATION LIMITATIONS: meal prep, cleaning, driving, shopping, community activity, and yard work  PERSONAL FACTORS: Fitness, Past/current experiences, and Time since onset of injury/illness/exacerbation are also affecting patient's functional outcome.   REHAB POTENTIAL: Good  CLINICAL DECISION MAKING: Stable/uncomplicated  EVALUATION COMPLEXITY: Low   GOALS: Goals reviewed with patient? Yes  SHORT TERM GOALS: Target date: 11/27/2023  Patient will be I with  initial HEP in order to progress with therapy. Baseline: HEP provided at eval Goal status: INITIAL  LONG TERM GOALS: Target date: 12/11/2023  Patient will be I with final HEP to maintain progress from PT. Baseline: HEP provided at eval Goal status: INITIAL  2.  Patient will report PSFS >/= 7 in order to indicate improvement in their functional ability. Baseline: 3.6 Goal status: INITIAL  3.  Patient will demonstrate right shoulder AROM grossly WFL and non painful to improve ability to perform household task without limitation Baseline: see above Goal status: INITIAL  4.  Patient will demonstrate right shoulder strength grossly 5/5 MMT in order to return to weight lifting and heavy household tasks Baseline: see above Goal status: INITIAL   PLAN: PT FREQUENCY: 1x/week  PT DURATION: 6 weeks  PLANNED INTERVENTIONS: 97164- PT Re-evaluation, 97750- Physical Performance Testing, 97110-Therapeutic exercises, 97530- Therapeutic activity, 97112- Neuromuscular re-education, 97535- Self Care, 02859- Manual therapy, 20560 (1-2 muscles), 20561 (3+ muscles)- Dry Needling, Taping, Joint mobilization, Joint manipulation, Spinal manipulation, Spinal mobilization, Cryotherapy, and Moist heat  PLAN FOR NEXT SESSION: Review HEP and progress PRN, manual/modalities for right pec region as needed, progress flexibility of pec, progressive loading of pec and periscapular  strength and control, thoracic mobility   Elaine Daring, PT, DPT, LAT, ATC 10/31/23  9:20 AM Phone: [phone] Fax: [phone]   PHYSICAL THERAPY DISCHARGE SUMMARY  Visits from Start of Care: 1  Current functional level related to goals / functional outcomes: See above   Remaining deficits: See above   Education / Equipment: HEP   Patient agrees to discharge. Patient goals were not met. Patient is being discharged due to not returning since the last visit.  Elaine Daring, PT, DPT, LAT, ATC 02/26/24  12:50 PM Phone: [phone] Fax: [phone]

## 2023-10-30 NOTE — Patient Instructions (Signed)
 Access Code: [email or web address] URL: [email or web address] Date: 10/30/2023 Prepared by: Elaine Daring  Exercises - Single Arm Doorway Pec Stretch at 90 Degrees Abduction  - 1-2 x daily - 3 reps - 30 seconds hold - Push Up with Plus  - 1 x daily - 3 sets - 8-10 reps - Supine Chest Flys  - 1 x daily - 3 sets - 10 reps - Prone T  - 1 x daily - 2 sets - 10 reps - 5 seconds hold - Standing Single Shoulder Flexion Wall Slide with Palm Up  - 1 x daily - 10 reps - 5 seconds hold

## 2023-10-31 ENCOUNTER — Other Ambulatory Visit: Payer: Self-pay

## 2023-10-31 ENCOUNTER — Encounter: Payer: Self-pay | Admitting: Physical Therapy

## 2023-11-01 ENCOUNTER — Ambulatory Visit
Admission: RE | Admit: 2023-11-01 | Discharge: 2023-11-01 | Disposition: A | Discharge status: Home / Self Care | Source: Ambulatory Visit | Attending: Family Medicine | Admitting: Family Medicine

## 2023-11-01 DIAGNOSIS — R9389 Abnormal findings on diagnostic imaging of other specified body structures: Secondary | ICD-10-CM

## 2023-11-06 ENCOUNTER — Other Ambulatory Visit: Payer: Self-pay

## 2023-11-06 DIAGNOSIS — K769 Liver disease, unspecified: Secondary | ICD-10-CM

## 2023-11-07 NOTE — Progress Notes (Signed)
 [facility] [address] [place] [address] Phone: [phone] Subjective:   Benjamin Wolfe, am serving as a scribe for Dr. Arthea Benjamin.  I'm seeing this patient by the request  of:  Pcp, No  CC: Right pectoralis pain  YEP:Dlagzrupcz  10/08/2023 Seems to have some chronic changes noted in this area.  Unfortunately patient has also had a right sided peritracheal nodules as noted previously and do feel that further imaging with a CT scan without contrast is necessary as well.  Discussed with patient about different treatment options and patient has elected to try conservative therapy at the moment.  Discussed with patient about icing regimen, home exercises, discussed avoiding certain range of motion.  Discussed the importance of eccentric strengthening.  Follow-up again in 6 weeks otherwise to further evaluate treatment.      Update 11/19/2023 Benjamin Wolfe is a 48 y.o. male coming in with complaint of R pec pain. Patient states that he has not used his arm as much since last visit. Less pain with turning steering wheel.       No past medical history on file. No past surgical history on file. Social History   Socioeconomic History   Marital status: Single    Spouse name: Not on file   Number of children: Not on file   Years of education: Not on file   Highest education level: Not on file  Occupational History   Not on file  Tobacco Use   Smoking status: Not on file   Smokeless tobacco: Not on file  Substance and Sexual Activity   Alcohol use: Not on file   Drug use: Not on file   Sexual activity: Not on file  Other Topics Concern   Not on file  Social History Narrative   Not on file   Social Drivers of Health   Financial Resource Strain: Low Risk  (06/10/2023)   Received from [facility]   Overall Financial Resource Strain (CARDIA)    Difficulty of Paying Living Expenses: Not very hard  Food Insecurity: Low Risk  (10/09/2023)    Received from [facility]   Hunger Vital Sign    Within the past 12 months, you worried that your food would run out before you got money to buy more: Never true    Within the past 12 months, the food you bought just didn't last and you didn't have money to get more. : Never true  Transportation Needs: No Transportation Needs (10/09/2023)   Received from [facility]    In the past 12 months, has lack of reliable transportation kept you from medical appointments, meetings, work or from getting things needed for daily living? : No  Physical Activity: Insufficiently Active (06/05/2022)   Received from [facility]   Exercise Vital Sign    On average, how many days per week do you engage in moderate to strenuous exercise (like a brisk walk)?: 4 days    On average, how many minutes do you engage in exercise at this level?: 30 min  Stress: Stress Concern Present (06/05/2022)   Received from [facility] Occup[facility] - Occupational Stress Questionnaire    Feeling of Stress : To some extent  Social Connections: Somewhat Isolated (06/05/2022)   Received from [facility]   Social Network    How would you rate your social network (family, work, friends)?: Restricted participation with some degree of social isolation  Not on File No family history on file.  Current Outpatient Medications (Endocrine & Metabolic):    levothyroxine (SYNTHROID) 88 MCG tablet, Take 88 mcg by mouth daily.      Current Outpatient Medications (Other):    ALPRAZolam (XANAX) 0.5 MG tablet, Take by mouth.   buPROPion (WELLBUTRIN XL) 150 MG 24 hr tablet, Take 1 tablet by mouth every morning.   Reviewed prior external information including notes and imaging from  primary care provider As well as notes that were available from care everywhere and other healthcare systems.  Past medical history, social, surgical and family history all reviewed in electronic medical  record.  No pertanent information unless stated regarding to the chief complaint.   Review of Systems:  No headache, visual changes, nausea, vomiting, diarrhea, constipation, dizziness, abdominal pain, skin rash, fevers, chills, night sweats, weight loss, swollen lymph nodes, body aches, joint swelling, chest pain, shortness of breath, mood changes. POSITIVE muscle aches  Objective  Blood pressure 112/80, pulse 80, height 5' 10 (1.778 m), weight 176 lb (79.8 kg), SpO2 97%.   General: No apparent distress alert and oriented x3 mood and affect normal, dressed appropriately.  HEENT: Pupils equal, extraocular movements intact  Respiratory: Patient's speak in full sentences and does not appear short of breath  Cardiovascular: No lower extremity edema, non tender, no erythema  Patient does well except does have some very mild discomfort noted with adductor against resistance of the arm.  Good range of motion of the shoulder but still has positive crossover noted.  Limited  Limited muscular skeletal ultrasound was performed and interpreted by Benjamin Wolfe, M  Limited ultrasound shows the patient does still have some scarring noted at the pectoralis tendon muscular juncture.  Seems to be improving though.  Mild increase in Doppler flow in the area.  Continue inflammation and swelling noted of the acromioclavicular joint. Impression: Interval improvement of the pectoralis with continued inflammation of the acromioclavicular joint    Impression and Recommendations:    The above documentation has been reviewed and is accurate and complete Benjamin Jacinto M Zooey Schreurs, DO

## 2023-11-11 ENCOUNTER — Other Ambulatory Visit

## 2023-11-12 ENCOUNTER — Ambulatory Visit
Admission: RE | Admit: 2023-11-12 | Discharge: 2023-11-12 | Disposition: A | Discharge status: Home / Self Care | Source: Ambulatory Visit | Attending: Family Medicine | Admitting: Family Medicine

## 2023-11-12 DIAGNOSIS — K769 Liver disease, unspecified: Secondary | ICD-10-CM

## 2023-11-15 ENCOUNTER — Ambulatory Visit
Admission: RE | Admit: 2023-11-15 | Discharge: 2023-11-15 | Disposition: A | Discharge status: Home / Self Care | Source: Ambulatory Visit | Attending: Family Medicine | Admitting: Family Medicine

## 2023-11-17 ENCOUNTER — Ambulatory Visit: Payer: Self-pay | Admitting: Family Medicine

## 2023-11-19 ENCOUNTER — Ambulatory Visit: Admitting: Family Medicine

## 2023-11-19 ENCOUNTER — Encounter: Payer: Self-pay | Admitting: Family Medicine

## 2023-11-19 ENCOUNTER — Other Ambulatory Visit: Payer: Self-pay

## 2023-11-19 VITALS — BP 112/80 | HR 80 | Ht 70.0 in | Wt 176.0 lb

## 2023-11-19 DIAGNOSIS — M19011 Primary osteoarthritis, right shoulder: Secondary | ICD-10-CM | POA: Diagnosis not present

## 2023-11-19 DIAGNOSIS — M7581 Other shoulder lesions, right shoulder: Secondary | ICD-10-CM | POA: Diagnosis not present

## 2023-11-19 NOTE — Assessment & Plan Note (Signed)
 Still underlying arthritic changes.  Last injection was in February 2023.  Will continue to monitor.  Follow-up with me again 3 months

## 2023-11-19 NOTE — Assessment & Plan Note (Signed)
 Pectoralis seems to be healed at this time.  The inflammation seen previously is completely gone but still some scar tissue still noted.  Overall the patient has been able to increase activity.  Going back to a gym at the moment.  I think patient will do well and follow-up again in 3 months

## 2023-11-21 ENCOUNTER — Encounter: Admitting: Physical Therapy

## 2023-11-21 NOTE — Therapy (Incomplete)
 OUTPATIENT PHYSICAL THERAPY TREATMENT   Patient Name: Benjamin Wolfe MRN: [id number] DOB:[birth year], 48 y.o., male Today's Date: 11/21/2023   END OF SESSION:    No past medical history on file. No past surgical history on file. 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  Rationale for Evaluation and Treatment: Rehabilitation  ONSET DATE: Chronic    SUBJECTIVE:            SUBJECTIVE STATEMENT: ***  Eval: Patient reports right shoulder soreness and they saw an old tear of the right pec. States he has improved with some exercises since he saw the doctor. He reports she can still feel the discomfort when he turns the steering wheel of his car. He does have history of right shoulder injury.   Hand dominance: Left  PERTINENT HISTORY: See PMH above  PAIN:  Are you having pain? Yes:  NPRS scale: 1/10 Pain location: Right pec Pain description: Sharp Aggravating factors: Turning steering wheel, reaching across his body Relieving factors: Rest  PRECAUTIONS: None  PATIENT GOALS: Pain relief   OBJECTIVE:  Note: Objective measures were completed at Evaluation unless otherwise noted. PATIENT SURVEYS:  PSFS: 3.6 Driving (turning steering wheel to left): 1 Reaching high item at home: 4 Rehab exercises for injury provided 2 months ago: 6  POSTURE: Rounded shoulder and forward head posture increased thoracic kyphosis and inferior angle scapular winging  UPPER EXTREMITY ROM:   Active ROM Right eval Left eval  Shoulder  flexion 130 155  Shoulder extension    Shoulder abduction 160 170  Shoulder adduction    Shoulder internal rotation T10 T7  Shoulder external rotation T1 T2  Elbow flexion    Elbow extension    Wrist flexion    Wrist extension    Wrist ulnar deviation    Wrist radial deviation    Wrist pronation    Wrist supination    (Blank rows = not tested)  UPPER EXTREMITY MMT:  MMT Right eval Left eval  Shoulder flexion 5 5  Shoulder extension    Shoulder abduction 5 5  Shoulder adduction    Shoulder internal rotation 4 5  Shoulder external rotation 4+ 5  Middle trapezius 4 4  Lower trapezius 4- 4-  Elbow flexion    Elbow extension    Wrist flexion    Wrist extension    Wrist ulnar deviation    Wrist radial deviation    Wrist pronation    Wrist supination    Grip strength (lbs)    (Blank rows = not tested)  SHOULDER SPECIAL TESTS: Not assessed  JOINT MOBILITY TESTING:  Right GHJ grossly WFL  PALPATION:  Mildly tender with deep palpation to right pec major closer to insertional region  TREATMENT [facility] Adult PT Treatment:                                                DATE: 11/21/2023 Single arm doorway pec stretch with emphasis on scapular retraction, depression, posterior tilt Push-up plus from elevated height progressing down as tolerated Supine chest fly preferably on FR beginning with light weights/cans of soup and progressing as tolerated Prone T Standing single arm wall slide with thumb or palm back  Discussed use of modalities and soft tissue work vs active exercise to improve pec mobility and realignment of muscle fibers following muscle strain. Gradual progressive loading of pec through full range of motion, continuing with previously provided exercises that seem more geared toward scapular control.   PATIENT EDUCATION: Education details:  HEP Person educated: Patient Education method: Solicitor, Actor cues, Verbal cues, and Handouts Education comprehension: verbalized understanding, returned demonstration, verbal cues required, tactile cues required, and needs further education  HOME EXERCISE PROGRAM: Access Code: [email or web address]    ASSESSMENT: CLINICAL IMPRESSION: Patient tolerated therapy well with no adverse effects. *** Patient would benefit from continued skilled PT to progress mobility and strength in order to reduce pain and maximize functional ability.   Eval: Patient is a 48 y.o. male who was seen today for physical therapy evaluation and treatment for chronic right pec/shoulder pain that is consistent with pec major strain. He does exhibit some limitations with his right shoulder active motion with normal passive motion indicating more strength and control deficit. He does have some weakness about the right shoulder and periscapular region with postural deficits and scapular dyskinesis. He does continue to report pain with more active horizontal adduction related movements consistent with his pec strain. Exercises provided this visit to target mobility and progressive loading of the pec, as well as addressing shoulder strength, control, and active motion.  OBJECTIVE IMPAIRMENTS: decreased activity tolerance, decreased ROM, decreased strength, impaired flexibility, postural dysfunction, and pain.   ACTIVITY LIMITATIONS: carrying, lifting, bathing, dressing, reach over head, and hygiene/grooming  PARTICIPATION LIMITATIONS: meal prep, cleaning, driving, shopping, community activity, and yard work  PERSONAL FACTORS: Fitness, Past/current experiences, and Time since onset of injury/illness/exacerbation are also affecting patient's functional outcome.    GOALS: Goals reviewed with patient? Yes  SHORT TERM GOALS: Target date: 11/27/2023  Patient will be I with initial HEP in order to progress with  therapy. Baseline: HEP provided at eval Goal status: INITIAL  LONG TERM GOALS: Target date: 12/11/2023  Patient will be I with final HEP to maintain progress from PT. Baseline: HEP provided at eval Goal status: INITIAL  2.  Patient will report PSFS >/= 7 in order to indicate improvement in their functional ability. Baseline: 3.6 Goal status: INITIAL  3.  Patient will demonstrate right shoulder AROM grossly WFL and non painful to improve ability to perform household task without limitation Baseline: see above Goal status: INITIAL  4.  Patient will demonstrate right shoulder strength grossly 5/5 MMT in order to return to weight lifting and heavy household tasks Baseline: see above Goal status: INITIAL   PLAN: PT FREQUENCY: 1x/week  PT DURATION: 6 weeks  PLANNED INTERVENTIONS: 97164- PT Re-evaluation, 97750- Physical Performance Testing, 97110-Therapeutic exercises, 97530- Therapeutic activity, 97112- Neuromuscular re-education, 97535- Self Care, 02859- Manual therapy, 20560 (1-2 muscles), 20561 (3+ muscles)- Dry Needling, Taping, Joint mobilization, Joint manipulation, Spinal manipulation, Spinal  mobilization, Cryotherapy, and Moist heat  PLAN FOR NEXT SESSION: Review HEP and progress PRN, manual/modalities for right pec region as needed, progress flexibility of pec, progressive loading of pec and periscapular strength and control, thoracic mobility   Elaine Daring, PT, DPT, LAT, ATC 11/21/23  7:47 AM Phone: [phone] Fax: [phone]

## 2023-11-29 NOTE — Patient Instructions (Addendum)

## 2023-11-29 NOTE — Progress Notes (Signed)
 New Patient Visit  Subjective:     Patient ID: Benjamin Wolfe, male    DOB: Oct 04, 1975, 48 y.o.   MRN: [id number]  Chief Complaint  Patient presents with   Establish Care    No concerns, lives in [place] now so better drive here.     HPI  Discussed the use of AI scribe software for clinical note transcription with the patient, who gave verbal consent to proceed.  History of Present Illness Benjamin Wolfe is a 48 year old male with hypothyroidism who presents for establishment of care and management of thyroid medication.  Thyroid dysfunction and medication management - Transitioned from Armour Thyroid to levothyroxine 112 mcg daily approximately ten weeks ago - Over the past five weeks, experiences sluggishness and slowed activity - Last thyroid blood work performed four to five weeks ago - Anticipates need for further adjustment in thyroid medication dosage  Cardiac palpitations - Heart palpitations persist for over ten years - No change in palpitations with thyroid medication adjustments - Previous EKGs and stress tests unremarkable  Musculoskeletal symptoms and physical activity - Returned to gym after a period of decreased physical activity - Uses creatine, collagen powder, and protein supplement to support exercise regimen - Experienced muscle issues including a back tear and Achilles strain - Attributes musculoskeletal injuries to aging and prior inactivity  Dietary habits - Follows a low-carbohydrate diet emphasizing vegetable-based carbohydrates and higher protein intake     ROS Per HPI  Outpatient Encounter Medications as of 12/02/2023  Medication Sig   buPROPion (WELLBUTRIN XL) 150 MG 24 hr tablet Take 1 tablet by mouth every morning.   Chlorpheniramine Maleate (ALLERGY PO) Take by mouth.   levothyroxine (SYNTHROID) 112 MCG tablet Take 112 mcg by mouth daily.   ALPRAZolam (XANAX) 0.5 MG tablet Take by mouth. (Patient not taking: Reported on  12/02/2023)   [DISCONTINUED] levothyroxine (SYNTHROID) 88 MCG tablet Take 88 mcg by mouth daily.   No facility-administered encounter medications on file as of 12/02/2023.    History reviewed. No pertinent past medical history.  History reviewed. No pertinent surgical history.  History reviewed. No pertinent family history.  Social History   Socioeconomic History   Marital status: Single    Spouse name: Not on file   Number of children: Not on file   Years of education: Not on file   Highest education level: Not on file  Occupational History   Not on file  Tobacco Use   Smoking status: Not on file   Smokeless tobacco: Not on file  Substance and Sexual Activity   Alcohol use: Not on file   Drug use: Not on file   Sexual activity: Not on file  Other Topics Concern   Not on file  Social History Narrative   Not on file   Social Drivers of Health   Financial Resource Strain: Low Risk  (06/10/2023)   Received from [facility]   Overall Financial Resource Strain (CARDIA)    Difficulty of Paying Living Expenses: Not very hard  Food Insecurity: Low Risk  (10/09/2023)   Received from [facility]   Hunger Vital Sign    Within the past 12 months, you worried that your food would run out before you got money to buy more: Never true    Within the past 12 months, the food you bought just didn't last and you didn't have money to get more. : Never true  Transportation Needs: No Transportation Needs (10/09/2023)   Received from [facility]   Transportation    In the past 12 months, has lack of reliable transportation kept you from medical appointments, meetings, work or from getting things needed for daily living? : No  Physical Activity: Insufficiently Active (06/05/2022)   Received from [facility]   Exercise Vital Sign    On average, how many days per week do you engage in moderate to strenuous exercise (like a brisk walk)?: 4 days    On average, how many minutes do you engage in  exercise at this level?: 30 min  Stress: Stress Concern Present (06/05/2022)   Received from [facility] - Occupational Stress Questionnaire    Feeling of Stress : To some extent  Social Connections: Somewhat Isolated (06/05/2022)   Received from [facility]   Social Network    How would you rate your social network (family, work, friends)?: Restricted participation with some degree of social isolation  Intimate Partner Violence: Not At Risk (06/05/2022)   Received from [facility]   HITS    Over the last 12 months how often did your partner physically hurt you?: Never    Over the last 12 months how often did your partner insult you or talk down to you?: Never    Over the last 12 months how often did your partner threaten you with physical harm?: Never    Over the last 12 months how often did your partner scream or curse at you?: Never       Objective:    BP 108/72 (BP Location: Left Arm, Patient Position: Sitting)   Pulse 87   Temp 97.8 F (36.6 C) (Temporal)   Ht 5' 10 (1.778 m)   Wt 177 lb (80.3 kg)   SpO2 99%   BMI 25.40 kg/m    Physical Exam Vitals and nursing note reviewed.  Constitutional:      General: He is not in acute distress.    Appearance: Normal appearance.  HENT:     Head: Normocephalic and atraumatic.     Right Ear: External ear normal.     Left Ear: External ear normal.     Nose: Nose normal.     Mouth/Throat:     Mouth: Mucous membranes are moist.     Pharynx: Oropharynx is clear.  Eyes:     Extraocular Movements: Extraocular movements intact.  Cardiovascular:     Rate and Rhythm: Normal rate and regular rhythm.     Pulses: Normal pulses.     Heart sounds: Normal heart sounds.  Pulmonary:     Effort: Pulmonary effort is normal. No respiratory distress.     Breath sounds: Normal breath sounds. No wheezing, rhonchi or rales.  Musculoskeletal:        General: Normal range of motion.     Cervical  back: Normal range of motion.     Right lower leg: No edema.     Left lower leg: No edema.  Lymphadenopathy:     Cervical: No cervical adenopathy.  Skin:    General: Skin is warm and dry.  Neurological:     General: No focal deficit present.     Mental Status: He is alert and oriented to person, place, and time.  Psychiatric:        Mood and Affect: Mood normal.        Behavior: Behavior normal.     No results found for any visits on 12/02/23.      Assessment &  Plan:   Assessment and Plan Assessment & Plan Skin Lesions Rapidly growing mole and another mole near previous surgical site raise concern for skin cancer.  - Refer to dermatologist for evaluation and comprehensive skin cancer screening.  Hypothyroidism Long-standing hypothyroidism with elevated TSH at 9, indicating suboptimal control. Symptoms of sluggishness and fatigue persist. Endocrinologist involved. - Continue levothyroxine 112 mcg daily. - Re-evaluate thyroid function tests in one month. - Consult with endocrinologist for further management.  Liver Lesions Agreed to monitor with imaging unless symptoms change. - Repeat liver ultrasound next year unless symptoms change. - Monitor for new symptoms such as changes in stool color or consistency.  General Health Maintenance Due for colon cancer screening. Plans for dental appointment. Regular eye exams up to date. 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 - Schedule Cologuard test by end of year. - Encourage dental check-up. - Continue regular eye exams.  Follow-up Multiple ongoing health concerns including thyroid management and skin lesions. Proactive in monitoring health. - Schedule follow-up appointment in one year unless new symptoms arise. - Review CT scan results and update records.  BMI 25 - Continue efforts in healthy diet and activity level     Orders Placed This Encounter  Procedures   Ambulatory referral to Dermatology    Referral Priority:   Routine     Referral Type:   Consultation    Referral Reason:   Specialty Services Required    Requested Specialty:   Dermatology    Number of Visits Requested:   1     No orders of the defined types were placed in this encounter.   Return in about 1 year (around 12/01/2024) for cpe.  Corean LITTIE Ku, FNP

## 2023-12-02 ENCOUNTER — Encounter: Payer: Self-pay | Admitting: Family Medicine

## 2023-12-02 ENCOUNTER — Ambulatory Visit (INDEPENDENT_AMBULATORY_CARE_PROVIDER_SITE_OTHER): Admitting: Family Medicine

## 2023-12-02 VITALS — BP 108/72 | HR 87 | Temp 97.8°F | Ht 70.0 in | Wt 177.0 lb

## 2023-12-02 DIAGNOSIS — Z6825 Body mass index (BMI) 25.0-25.9, adult: Secondary | ICD-10-CM | POA: Insufficient documentation

## 2023-12-02 DIAGNOSIS — E039 Hypothyroidism, unspecified: Secondary | ICD-10-CM | POA: Insufficient documentation

## 2023-12-02 DIAGNOSIS — D229 Melanocytic nevi, unspecified: Secondary | ICD-10-CM | POA: Diagnosis not present

## 2023-12-02 DIAGNOSIS — K7689 Other specified diseases of liver: Secondary | ICD-10-CM | POA: Insufficient documentation

## 2023-12-02 DIAGNOSIS — Z1283 Encounter for screening for malignant neoplasm of skin: Secondary | ICD-10-CM | POA: Insufficient documentation

## 2023-12-30 ENCOUNTER — Ambulatory Visit: Admitting: Dermatology

## 2024-01-01 ENCOUNTER — Ambulatory Visit (INDEPENDENT_AMBULATORY_CARE_PROVIDER_SITE_OTHER): Admitting: Dermatology

## 2024-01-01 ENCOUNTER — Encounter: Payer: Self-pay | Admitting: Dermatology

## 2024-01-01 VITALS — BP 120/76 | HR 74

## 2024-01-01 DIAGNOSIS — D2272 Melanocytic nevi of left lower limb, including hip: Secondary | ICD-10-CM

## 2024-01-01 DIAGNOSIS — D229 Melanocytic nevi, unspecified: Secondary | ICD-10-CM

## 2024-01-01 DIAGNOSIS — L578 Other skin changes due to chronic exposure to nonionizing radiation: Secondary | ICD-10-CM

## 2024-01-01 DIAGNOSIS — L918 Other hypertrophic disorders of the skin: Secondary | ICD-10-CM

## 2024-01-01 DIAGNOSIS — D1730 Benign lipomatous neoplasm of skin and subcutaneous tissue of unspecified sites: Secondary | ICD-10-CM

## 2024-01-01 DIAGNOSIS — W908XXA Exposure to other nonionizing radiation, initial encounter: Secondary | ICD-10-CM

## 2024-01-01 DIAGNOSIS — D2271 Melanocytic nevi of right lower limb, including hip: Secondary | ICD-10-CM

## 2024-01-01 DIAGNOSIS — Q825 Congenital non-neoplastic nevus: Secondary | ICD-10-CM

## 2024-01-01 NOTE — Progress Notes (Signed)
   New Patient Visit   Subjective  Benjamin Wolfe is a 48 y.o. male who presents for the following:   Patient states he  has a mole of concern located at the right 2nd toe that he would like to have examined. Patient reports the areas have been there since he was born.  He states that he feels like it has gotten more irritated with time.  Patient reports he  has not previously been treated for these areas. Patient confirms Hx of bx. Patient declines family history of skin cancer(s).  There are also some lesions of concern on his left buttock and right posterior thigh, present for several months, not symptomatic but he would like to understand what they are.   The patient has spots, moles and lesions to be evaluated, some may be new or changing.  The following portions of the chart were reviewed this encounter and updated as appropriate: medications, allergies, medical history  Review of Systems:  No other skin or systemic complaints except as noted in HPI or Assessment and Plan.  Objective  Well appearing patient in no apparent distress; mood and affect are within normal limits.   A focused examination was performed of the following areas: Right foot and buttocks, right thigh  Relevant exam findings are noted in the Assessment and Plan.  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 Recommend observation. Call clinic for new or changing moles. Recommend daily use of broad spectrum spf 30+ sunscreen to sun-exposed areas.   Tag Nevus vs Nevus Lipomatosus- Left buttock - Reassured on benign nature  ACTINIC DAMAGE - chronic, secondary to cumulative UV radiation exposure/sun exposure over time - diffuse scaly erythematous macules with  underlying dyspigmentation - Recommend daily broad spectrum sunscreen SPF 30+ to sun-exposed areas, reapply every 2 hours as needed.  - Recommend staying in the shade or wearing long sleeves, sun glasses (UVA+UVB protection) and wide brim hats (4-inch brim around the entire circumference of the hat). - Call for new or changing lesions.  NEVUS (3) Left 4th Dorsal Mid Toe, Right 3rd Plantar Toe, Right 4th-5th Toe Web Benign. Discussed removing. Patient declined at this time. Will monitor and recheck in 3 months.  Return in about 3 months (around 04/02/2024) for Spot check/ Need FBSE.  LILLETTE Rollene Gobble, RN, am acting as scribe for RUFUS CHRISTELLA HOLY, MD .   Documentation: I have reviewed the above documentation for accuracy and completeness, and I agree with the above.  RUFUS CHRISTELLA HOLY, MD

## 2024-01-01 NOTE — Patient Instructions (Signed)

## 2024-01-04 ENCOUNTER — Encounter: Payer: Self-pay | Admitting: Family Medicine

## 2024-01-04 DIAGNOSIS — K769 Liver disease, unspecified: Secondary | ICD-10-CM

## 2024-02-06 ENCOUNTER — Other Ambulatory Visit: Payer: Self-pay

## 2024-02-06 DIAGNOSIS — K7689 Other specified diseases of liver: Secondary | ICD-10-CM

## 2024-02-06 DIAGNOSIS — K769 Liver disease, unspecified: Secondary | ICD-10-CM

## 2024-02-18 NOTE — Progress Notes (Unsigned)
 [facility] [address] [place] [address] Phone: [phone] Subjective:   [name], am serving as a scribe for Dr. Arthea Claudene.  I'm seeing this patient by the request  of:  Alvia Corean CROME, FNP  CC: Right shoulder and pec pain  YEP:Dlagzrupcz  11/19/2023 Still underlying arthritic changes.  Last injection was in February 2023.  Will continue to monitor.  Follow-up with me again 3 months     Pectoralis seems to be healed at this time.  The inflammation seen previously is completely gone but still some scar tissue still noted.  Overall the patient has been able to increase activity.  Going back to a gym at the moment.  I think patient will do well and follow-up again in 3 months      Update 02/19/2024 Benjamin Wolfe is a 48 y.o. male coming in with complaint of R shoulder and pec pain. Patient states that he is doing better. Has minimal pain.   States that he is sore in multiple joints throughout his body. Pain in both achilles tendons. Painful to wear his shoes. Is able to do box jumps but they have to be done at that the end of the workout.   Has pain occasionally in R bicep tendon.   Also notes imbalance between L and R glute.   Hx of inguinal hernia 7 years ago but he does have some pain in the anterior hip from time to time.        No past medical history on file. No past surgical history on file. Social History   Socioeconomic History   Marital status: Single    Spouse name: Not on file   Number of children: Not on file   Years of education: Not on file   Highest education level: Not on file  Occupational History   Not on file  Tobacco Use   Smoking status: Unknown   Smokeless tobacco: Not on file  Substance and Sexual Activity   Alcohol use: Not on file   Drug use: Not on file   Sexual activity: Not on file  Other Topics Concern   Not on file  Social History Narrative   Not on file   Social  Drivers of Health   Financial Resource Strain: Low Risk  (06/10/2023)   Received from [facility]   Overall Financial Resource Strain (CARDIA)    Difficulty of Paying Living Expenses: Not very hard  Food Insecurity: Low Risk  (10/09/2023)   Received from [facility]   Hunger Vital Sign    Within the past 12 months, you worried that your food would run out before you got money to buy more: Never true    Within the past 12 months, the food you bought just didn't last and you didn't have money to get more. : Never true  Transportation Needs: No Transportation Needs (10/09/2023)   Received from [facility]    In the past 12 months, has lack of reliable transportation kept you from medical appointments, meetings, work or from getting things needed for daily living? : No  Physical Activity: Insufficiently Active (06/05/2022)   Received from [facility]   Exercise Vital Sign    On average, how many days per week do you engage in moderate to strenuous exercise (like a brisk walk)?: 4 days    On average, how many minutes do you engage in exercise at this level?: 30 min  Stress: Stress Concern Present (06/05/2022)   Received from [facility] - Occupational Stress Questionnaire    Feeling of Stress : To some extent  Social Connections: Somewhat Isolated (06/05/2022)   Received from [facility]   Social Network    How would you rate your social network (family, work, friends)?: Restricted participation with some degree of social isolation   Allergies  Allergen Reactions   Horse-Derived Products Dermatitis   No family history on file.  Current Outpatient Medications (Endocrine & Metabolic):    levothyroxine (SYNTHROID) 112 MCG tablet, Take 112 mcg by mouth daily.   Current Outpatient Medications (Respiratory):    Chlorpheniramine Maleate (ALLERGY PO), Take by mouth.    Current Outpatient Medications (Other):    ALPRAZolam  (XANAX) 0.5 MG tablet, Take by mouth.   buPROPion (WELLBUTRIN XL) 150 MG 24 hr tablet, Take 1 tablet by mouth every morning.   Reviewed prior external information including notes and imaging from  primary care provider As well as notes that were available from care everywhere and other healthcare systems.  Past medical history, social, surgical and family history all reviewed in electronic medical record.  No pertanent information unless stated regarding to the chief complaint.   Review of Systems:  No headache, visual changes, nausea, vomiting, diarrhea, constipation, dizziness, abdominal pain, skin rash, fevers, chills, night sweats, weight loss, swollen lymph nodes, body aches, joint swelling, chest pain, shortness of breath, mood changes. POSITIVE muscle aches  Objective  Blood pressure 104/76, pulse 80, height 5' 10 (1.778 m), weight 180 lb (81.6 kg), SpO2 98%.   General: No apparent distress alert and oriented x3 mood and affect normal, dressed appropriately.  HEENT: Pupils equal, extraocular movements intact  Respiratory: Patient's speak in full sentences and does not appear short of breath  Cardiovascular: No lower extremity edema, non tender, no erythema  Right shoulder still has some positive impingement noted.  Positive crossover noted.  Limited muscular skeletal ultrasound was performed and interpreted by CLAUDENE HUSSAR, M  Limited ultrasound shows no significant findings that is concerning at this time.    Impression and Recommendations:     The above documentation has been reviewed and is accurate and complete Phylicia Mcgaugh M Chamille Werntz, DO

## 2024-02-19 ENCOUNTER — Ambulatory Visit: Admitting: Family Medicine

## 2024-02-19 ENCOUNTER — Other Ambulatory Visit: Payer: Self-pay

## 2024-02-19 ENCOUNTER — Encounter: Payer: Self-pay | Admitting: Family Medicine

## 2024-02-19 ENCOUNTER — Ambulatory Visit: Payer: Self-pay | Admitting: Family Medicine

## 2024-02-19 VITALS — BP 104/76 | HR 80 | Ht 70.0 in | Wt 180.0 lb

## 2024-02-19 DIAGNOSIS — M7581 Other shoulder lesions, right shoulder: Secondary | ICD-10-CM

## 2024-02-19 DIAGNOSIS — M19011 Primary osteoarthritis, right shoulder: Secondary | ICD-10-CM

## 2024-02-19 DIAGNOSIS — M25511 Pain in right shoulder: Secondary | ICD-10-CM

## 2024-02-19 DIAGNOSIS — R5383 Other fatigue: Secondary | ICD-10-CM | POA: Diagnosis not present

## 2024-02-19 DIAGNOSIS — M130 Polyarthritis, unspecified: Secondary | ICD-10-CM

## 2024-02-19 LAB — CBC WITH DIFFERENTIAL/PLATELET
Basophils Absolute: 0.1 K/uL (ref 0.0–0.1)
Basophils Relative: 1.9 % (ref 0.0–3.0)
Eosinophils Absolute: 0.2 K/uL (ref 0.0–0.7)
Eosinophils Relative: 4.9 % (ref 0.0–5.0)
HCT: 41 % (ref 39.0–52.0)
Hemoglobin: 14.4 g/dL (ref 13.0–17.0)
Lymphocytes Relative: 34.2 % (ref 12.0–46.0)
Lymphs Abs: 1.3 K/uL (ref 0.7–4.0)
MCHC: 35.2 g/dL (ref 30.0–36.0)
MCV: 88.1 fl (ref 78.0–100.0)
Monocytes Absolute: 0.4 K/uL (ref 0.1–1.0)
Monocytes Relative: 10.5 % (ref 3.0–12.0)
Neutro Abs: 1.8 K/uL (ref 1.4–7.7)
Neutrophils Relative %: 48.5 % (ref 43.0–77.0)
Platelets: 241 K/uL (ref 150.0–400.0)
RBC: 4.65 Mil/uL (ref 4.22–5.81)
RDW: 12.8 % (ref 11.5–15.5)
WBC: 3.8 K/uL — ABNORMAL LOW (ref 4.0–10.5)

## 2024-02-19 LAB — TSH: TSH: 6.21 u[IU]/mL — ABNORMAL HIGH (ref 0.35–5.50)

## 2024-02-19 LAB — COMPREHENSIVE METABOLIC PANEL WITH GFR
ALT: 24 U/L (ref 0–53)
AST: 26 U/L (ref 0–37)
Albumin: 4.6 g/dL (ref 3.5–5.2)
Alkaline Phosphatase: 48 U/L (ref 39–117)
BUN: 18 mg/dL (ref 6–23)
CO2: 29 meq/L (ref 19–32)
Calcium: 9.1 mg/dL (ref 8.4–10.5)
Chloride: 100 meq/L (ref 96–112)
Creatinine, Ser: 1.36 mg/dL (ref 0.40–1.50)
GFR: 61.58 mL/min (ref >60.00)
Glucose, Bld: 99 mg/dL (ref 70–99)
Potassium: 4 meq/L (ref 3.5–5.1)
Sodium: 137 meq/L (ref 135–145)
Total Bilirubin: 0.9 mg/dL (ref 0.2–1.2)
Total Protein: 7 g/dL (ref 6.0–8.3)

## 2024-02-19 LAB — VITAMIN D 25 HYDROXY (VIT D DEFICIENCY, FRACTURES): VITD: 26.59 ng/mL — ABNORMAL LOW (ref 30.00–100.00)

## 2024-02-19 LAB — C-REACTIVE PROTEIN: CRP: <0.5 mg/dL (ref 0.5–20.0)

## 2024-02-19 LAB — VITAMIN B12: Vitamin B-12: 504 pg/mL (ref 211–911)

## 2024-02-19 LAB — TESTOSTERONE: Testosterone: 367.86 ng/dL (ref 300.00–890.00)

## 2024-02-19 LAB — IBC PANEL
Iron: 114 ug/dL (ref 42–165)
Saturation Ratios: 41.1 % (ref 20.0–50.0)
TIBC: 277.2 ug/dL (ref 250.0–450.0)
Transferrin: 198 mg/dL — ABNORMAL LOW (ref 212.0–360.0)

## 2024-02-19 LAB — URIC ACID: Uric Acid, Serum: 6.4 mg/dL (ref 4.0–7.8)

## 2024-02-19 LAB — SEDIMENTATION RATE: Sed Rate: 1 mm/h (ref 0–15)

## 2024-02-19 LAB — FERRITIN: Ferritin: 133.2 ng/mL (ref 22.0–322.0)

## 2024-02-19 NOTE — Patient Instructions (Addendum)
 Labs today Heel lifts See me in 9-10 weeks

## 2024-02-19 NOTE — Assessment & Plan Note (Signed)
 Improvement is still noted.  Need to continue to monitor.  Can place some impingement as well.

## 2024-02-19 NOTE — Assessment & Plan Note (Signed)
 Will get some laboratory workup to further evaluate.  Patient will have some hormone levels checked as well as uric acid level.  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  Patient was doing somewhat good on Armour at 1 point but unfortunately had palpitations.

## 2024-02-19 NOTE — Assessment & Plan Note (Signed)
 Significant improvement.  Still some mild impingement noted on the anterior aspect of the shoulder but nothing significant.  Having multiple different other polyarthropathies.  Will get laboratory workup to further evaluate.  Discussed with patient that could be a multitude of different factors.  Depending on findings we will discuss medical management thereafter.

## 2024-02-21 LAB — CYCLIC CITRUL PEPTIDE ANTIBODY, IGG: Cyclic Citrullin Peptide Ab: <16 U

## 2024-02-21 LAB — RHEUMATOID FACTOR: Rheumatoid fact SerPl-aCnc: <10 [IU]/mL (ref <14)

## 2024-02-21 LAB — CALCIUM, IONIZED: Calcium, Ion: 5.2 mg/dL (ref 4.7–5.5)

## 2024-02-21 LAB — PTH, INTACT AND CALCIUM
Calcium: 9.5 mg/dL (ref 8.6–10.3)
PTH: 14 pg/mL — ABNORMAL LOW (ref 16–77)

## 2024-02-21 LAB — ANGIOTENSIN CONVERTING ENZYME: Angiotensin-Converting Enzyme: 42 U/L (ref 9–67)

## 2024-02-21 LAB — ANA: Anti Nuclear Antibody (ANA): NEGATIVE

## 2024-03-11 ENCOUNTER — Telehealth: Payer: Self-pay

## 2024-03-11 NOTE — Telephone Encounter (Signed)
 Copied from CRM [id number]. Topic: Clinical - Lab/Test Results >> Mar 11, 2024  8:21 AM Deaijah H wrote: Reason for CRM: Vicky w/ [facility] called in due to receiving referral dx is liver lesion, but nothing in referral indicating patient has that. Need some proof, ultrasound, MRI, CAT scan one of those. Need by close of business today 4:30 or referral will be closed out. Fax: [phone] Phone: [phone]

## 2024-03-11 NOTE — Telephone Encounter (Signed)
 Spoke with patient regarding this, will fax over recent US  and CT scan.  Patient had an off TSH level and a  low vit d level that was checked by Dr.Smith, patient states he follows with an Endo doctor for the TSH. Does he need a high dose vitamin d script sent in or does he need to come in for a F/U with you? Has not been seen since July. Please advise

## 2024-03-31 ENCOUNTER — Encounter: Payer: Self-pay | Admitting: Dermatology

## 2024-03-31 ENCOUNTER — Ambulatory Visit: Admitting: Dermatology

## 2024-03-31 DIAGNOSIS — D229 Melanocytic nevi, unspecified: Secondary | ICD-10-CM

## 2024-03-31 DIAGNOSIS — L739 Follicular disorder, unspecified: Secondary | ICD-10-CM | POA: Diagnosis not present

## 2024-03-31 DIAGNOSIS — D2271 Melanocytic nevi of right lower limb, including hip: Secondary | ICD-10-CM

## 2024-03-31 DIAGNOSIS — D2272 Melanocytic nevi of left lower limb, including hip: Secondary | ICD-10-CM | POA: Diagnosis not present

## 2024-03-31 MED ORDER — CLINDAMYCIN PHOSPHATE 1 % EX LOTN: TOPICAL_LOTION | Freq: Three times a day (TID) | CUTANEOUS | 1 refills | Status: AC

## 2024-03-31 NOTE — Progress Notes (Signed)
   Follow-Up Visit   Subjective  Benjamin Wolfe is a 48 y.o. male who presents for the following: recheck  Pt here to recheck nevus on both feet. He does not feel like they have changed. He states that he has a lesion of concern in his groin, between his anus and scrotum, irritated at times.   The following portions of the chart were reviewed this encounter and updated as appropriate: medications, allergies, medical history  Review of Systems:  No other skin or systemic complaints except as noted in HPI or Assessment and Plan.  Objective  Well appearing patient in no apparent distress; mood and affect are within normal limits.  A focused examination was performed of the following areas: Bilateral feet Genital region     Relevant exam findings are noted in the Assessment and Plan.    Assessment & Plan   MELANOCYTIC NEVI Left 4th Dorsal Mid Toe, Right 3rd Plantar Toe, Right 4th-5th Toe Web  Exam: Tan-brown and/or pink-flesh-colored symmetric macules and papules  Treatment Plan: Benign appearing on exam today. Recommend observation. Call clinic for new or changing moles. Recommend daily use of broad spectrum spf 30+ sunscreen to sun-exposed areas.    FOLLICULITIS on perineum Exam: Perifollicular erythematous papules and pustules  Folliculitis occurs due to inflammation of the superficial hair follicle (pore), resulting in acne-like lesions (pus bumps). It can be infectious (bacterial, fungal) or noninfectious (shaving, tight clothing, heat/sweat, medications).  Folliculitis can be acute or chronic and recommended treatment depends on the underlying cause of folliculitis.  Treatment Plan: Hibiclens as wash prn  Clindamycin  lotion tid when tender    Return for TBSE with brenda, hair loss with dr alm.  I, Darice Smock, CMA, am acting as scribe for RUFUS CHRISTELLA HOLY, MD.   Documentation: I have reviewed the above documentation for accuracy and completeness, and I agree with  the above.  RUFUS CHRISTELLA HOLY, MD

## 2024-03-31 NOTE — Patient Instructions (Signed)

## 2024-05-13 ENCOUNTER — Encounter: Payer: Self-pay | Admitting: Pediatrics

## 2024-05-19 NOTE — Progress Notes (Unsigned)
 " [name] [address] [place] [address] Phone: [phone] Subjective:   Benjamin Wolfe, am serving as a scribe for Dr. Arthea Claudene.  I'm seeing this patient by the request  of:  Alvia Corean CROME, FNP  CC: Right hip pain  YEP:Dlagzrupcz  Benjamin Wolfe is a 49 y.o. male coming in with complaint of R hip pain. Seen for shoulder pain in October 2025. Patient states has gotten a lot better. Still feels it in the groin area, but not much. Did some PT and made it worse.  Found to have elevation in uric acid and  ?recheck TSH  No past medical history on file. No past surgical history on file. Social History   Socioeconomic History   Marital status: Single    Spouse name: Not on file   Number of children: Not on file   Years of education: Not on file   Highest education level: Not on file  Occupational History   Not on file  Tobacco Use   Smoking status: Unknown   Smokeless tobacco: Not on file  Substance and Sexual Activity   Alcohol use: Not on file   Drug use: Not on file   Sexual activity: Not on file  Other Topics Concern   Not on file  Social History Narrative   Not on file   Social Drivers of Health   Tobacco Use: Medium Risk (02/20/2024)   Received from [facility]   Patient History    Smoking Tobacco Use: Former    Smokeless Tobacco Use: Never    Passive Exposure: Past  Physicist, Medical Strain: Low Risk (02/19/2024)   Received from [facility]   Overall Financial Resource Strain (CARDIA)    How hard is it for you to pay for the very basics like food, housing, medical care, and heating?: Not hard at all  Food Insecurity: No Food Insecurity (02/19/2024)   Received from [facility]   Epic    Within the past 12 months, you worried that your food would run out before you got the money to buy more.: Never true    Within the past 12 months, the food you bought just didn't last and you didn't have money to  get more.: Never true  Transportation Needs: No Transportation Needs (02/19/2024)   Received from [facility]    In the past 12 months, has lack of transportation kept you from medical appointments or from getting medications?: No    In the past 12 months, has lack of transportation kept you from meetings, work, or from getting things needed for daily living?: No  Physical Activity: Sufficiently Active (02/19/2024)   Received from [facility]   Exercise Vital Sign    On average, how many days per week do you engage in moderate to strenuous exercise (like a brisk walk)?: 5 days    On average, how many minutes do you engage in exercise at this level?: 60 min  Stress: Stress Concern Present (02/19/2024)   Received from [facility] - Occupational Stress Questionnaire    Do you feel stress - tense, restless, nervous, or anxious, or unable to sleep at night because your mind is troubled all the time - these days?: To some extent  Social Connections: Somewhat Isolated (02/19/2024)   Received from [facility]   Social Network    How would you rate your social network (family, work, friends)?:  Restricted participation with some degree of social isolation  Depression (PHQ2-9): Medium Risk (12/02/2023)   Depression (PHQ2-9)    PHQ-2 Score: 8  Alcohol Screen: Not on file  Housing: Low Risk (02/19/2024)   Received from [facility]    In the last 12 months, was there a time when you were not able to pay the mortgage or rent on time?: No    In the past 12 months, how many times have you moved where you were living?: 1    At any time in the past 12 months, were you homeless or living in a shelter (including now)?: No  Utilities: Not At Risk (02/19/2024)   Received from [facility]    In the past 12 months has the electric, gas, oil, or water company threatened to shut off services in your home?: No  Health Literacy: Not on  file   Allergies[1] No family history on file.  Current Outpatient Medications (Endocrine & Metabolic):    levothyroxine (SYNTHROID) 112 MCG tablet, Take 112 mcg by mouth daily.  Current Outpatient Medications (Respiratory):    Chlorpheniramine Maleate (ALLERGY PO), Take by mouth.  Current Outpatient Medications (Other):    ALPRAZolam (XANAX) 0.5 MG tablet, Take by mouth.   buPROPion (WELLBUTRIN XL) 150 MG 24 hr tablet, Take 1 tablet by mouth every morning.   clindamycin  (CLEOCIN -T) 1 % lotion, Apply topically in the morning, at noon, and at bedtime.   Reviewed prior external information including notes and imaging from  primary care provider As well as notes that were available from care everywhere and other healthcare systems.  Past medical history, social, surgical and family history all reviewed in electronic medical record.  No pertanent information unless stated regarding to the chief complaint.   Review of Systems:  No headache, visual changes, nausea, vomiting, diarrhea, constipation, dizziness, abdominal pain, skin rash, fevers, chills, night sweats, weight loss, swollen lymph nodes, body aches, joint swelling, chest pain, shortness of breath, mood changes. POSITIVE muscle aches  Objective  Blood pressure 110/78, pulse 82, height 5' 10 (1.778 m), weight 192 lb (87.1 kg), SpO2 99%.   General: No apparent distress alert and oriented x3 mood and affect normal, dressed appropriately.  HEENT: Pupils equal, extraocular movements intact  Respiratory: Patient's speak in full sentences and does not appear short of breath  Cardiovascular: No lower extremity edema, non tender, no erythema  Right hip limited range of motion, significant decrease compared to previous exam.  Mild positive fulcrum test noted.  Patient is tender in the inguinal area but no masses appreciated. Antalgic gait noted   Impression and Recommendations:     The above documentation has been reviewed and is  accurate and complete Arthea CHRISTELLA Sharps, DO       [1]  Allergies Allergen Reactions   Horse-Derived Products Dermatitis   "

## 2024-05-20 ENCOUNTER — Encounter: Payer: Self-pay | Admitting: Family Medicine

## 2024-05-20 ENCOUNTER — Ambulatory Visit: Payer: Self-pay | Admitting: Family Medicine

## 2024-05-20 ENCOUNTER — Ambulatory Visit: Admitting: Family Medicine

## 2024-05-20 VITALS — BP 110/78 | HR 82 | Ht 70.0 in | Wt 192.0 lb

## 2024-05-20 DIAGNOSIS — M25551 Pain in right hip: Secondary | ICD-10-CM

## 2024-05-20 DIAGNOSIS — M7061 Trochanteric bursitis, right hip: Secondary | ICD-10-CM | POA: Diagnosis not present

## 2024-05-20 DIAGNOSIS — M130 Polyarthritis, unspecified: Secondary | ICD-10-CM | POA: Diagnosis not present

## 2024-05-20 DIAGNOSIS — R5383 Other fatigue: Secondary | ICD-10-CM

## 2024-05-20 DIAGNOSIS — M25559 Pain in unspecified hip: Secondary | ICD-10-CM | POA: Insufficient documentation

## 2024-05-20 LAB — T4, FREE: Free T4: 1.02 ng/dL (ref 0.60–1.60)

## 2024-05-20 LAB — LIPID PANEL
Cholesterol: 182 mg/dL (ref 28–200)
HDL: 37.9 mg/dL — ABNORMAL LOW
LDL Cholesterol: 124 mg/dL — ABNORMAL HIGH (ref 10–99)
NonHDL: 144.33
Total CHOL/HDL Ratio: 5
Triglycerides: 102 mg/dL (ref 10.0–149.0)
VLDL: 20.4 mg/dL (ref 0.0–40.0)

## 2024-05-20 LAB — CBC WITH DIFFERENTIAL/PLATELET
Basophils Absolute: 0.1 K/uL (ref 0.0–0.1)
Basophils Relative: 1.6 % (ref 0.0–3.0)
Eosinophils Absolute: 0.2 K/uL (ref 0.0–0.7)
Eosinophils Relative: 4.3 % (ref 0.0–5.0)
HCT: 42.8 % (ref 39.0–52.0)
Hemoglobin: 15.3 g/dL (ref 13.0–17.0)
Lymphocytes Relative: 31.3 % (ref 12.0–46.0)
Lymphs Abs: 1.7 K/uL (ref 0.7–4.0)
MCHC: 35.7 g/dL (ref 30.0–36.0)
MCV: 88.2 fl (ref 78.0–100.0)
Monocytes Absolute: 0.5 K/uL (ref 0.1–1.0)
Monocytes Relative: 9.4 % (ref 3.0–12.0)
Neutro Abs: 2.9 K/uL (ref 1.4–7.7)
Neutrophils Relative %: 53.4 % (ref 43.0–77.0)
Platelets: 251 K/uL (ref 150.0–400.0)
RBC: 4.85 Mil/uL (ref 4.22–5.81)
RDW: 13.1 % (ref 11.5–15.5)
WBC: 5.4 K/uL (ref 4.0–10.5)

## 2024-05-20 LAB — T3, FREE: T3, Free: 3.2 pg/mL (ref 2.3–4.2)

## 2024-05-20 LAB — TSH: TSH: 8.15 u[IU]/mL — ABNORMAL HIGH (ref 0.35–5.50)

## 2024-05-20 LAB — URIC ACID: Uric Acid, Serum: 5.3 mg/dL (ref 4.0–7.8)

## 2024-05-20 NOTE — Assessment & Plan Note (Signed)
 Rechecked abnormal labs including, uric acid levels again to see if they are trending in which direction

## 2024-05-20 NOTE — Patient Instructions (Addendum)
 Labs today [facility] [phone] Call Today  When we receive your results we will contact you.  Check with GI about abdominal MRI See you again in 2-3 months

## 2024-05-20 NOTE — Assessment & Plan Note (Signed)
 Worsening pain of the right thigh, history of a hernia on this side as well though with surgical repair.  Feeling MRI of the pelvis would be beneficial.  Rule out such things as avascular necrosis with decreasing range of motion, gluteal tearing, labral tearing as well as the potential for a recurrent hernia.  Patient's pain seems to be out of proportion.  Will get laboratory workup as well to further evaluate anything else that could be potentially contributing.  Follow-up again in 6 to 8 weeks

## 2024-05-26 ENCOUNTER — Telehealth: Payer: Self-pay | Admitting: Family Medicine

## 2024-05-26 ENCOUNTER — Ambulatory Visit

## 2024-05-26 VITALS — Ht 70.0 in | Wt 181.0 lb

## 2024-05-26 DIAGNOSIS — K7689 Other specified diseases of liver: Secondary | ICD-10-CM

## 2024-05-26 MED ORDER — NA SULFATE-K SULFATE-MG SULF 17.5-3.13-1.6 GM/177ML PO SOLN: 1.0000 | Freq: Once | ORAL | 0 refills | Status: AC

## 2024-05-26 NOTE — Progress Notes (Signed)
 No egg or soy allergy known to patient  No issues known to pt with past sedation with any surgeries or procedures Patient denies ever being told they had issues or difficulty with intubation  No FH of Malignant Hyperthermia Pt is not on diet pills Pt is not on  home 02  Pt is not on blood thinners  Pt has intermittent issues with constipation  No A fib or A flutter Have any cardiac testing pending--No Pt can ambulate  Pt denies use of chewing tobacco Discussed diabetic I weight loss medication holds Discussed NSAID holds Checked BMI Pt instructed to use Single[employer] or GoodRx for a price reduction on prep  Patient's chart reviewed by Cathlyn Parsons CNRA prior to previsit and patient appropriate for the [facility].  Pre visit completed and red dot placed by patient's name on their procedure day (on provider's schedule).

## 2024-05-26 NOTE — Telephone Encounter (Signed)
 I received a call from [place] at [facility]. She said that the patient is scheduled for his MRI on 06/02/2024. They are showing his BCBS is inactive and have called the patient and left a message to confirm. Epic is showing it as active.  Do you have any information from the authorization?  Etta can be reached at [phone]

## 2024-05-26 NOTE — Telephone Encounter (Signed)
 Been on hold forever for patient precert mri. Found a precert auth form online filled out and faxed. States up tp 5 business day turnaround

## 2024-05-29 ENCOUNTER — Encounter: Payer: Self-pay | Admitting: Pediatrics

## 2024-06-02 ENCOUNTER — Other Ambulatory Visit

## 2024-06-08 ENCOUNTER — Ambulatory Visit: Admitting: Physician Assistant

## 2024-06-09 ENCOUNTER — Encounter: Admitting: Pediatrics

## 2024-06-12 ENCOUNTER — Ambulatory Visit: Admitting: Family Medicine

## 2024-06-16 ENCOUNTER — Ambulatory Visit: Admitting: Family Medicine

## 2024-06-17 ENCOUNTER — Encounter: Admitting: Gastroenterology

## 2024-06-23 ENCOUNTER — Ambulatory Visit: Admitting: Physician Assistant

## 2024-06-25 ENCOUNTER — Encounter: Admitting: Gastroenterology

## 2024-07-21 ENCOUNTER — Ambulatory Visit: Admitting: Family Medicine

## 2024-10-07 ENCOUNTER — Ambulatory Visit: Admitting: Dermatology
